# Patient Record
Sex: Female | Born: 1975 | Race: White | Hispanic: No | Marital: Married | Smoking: Current every day smoker
Health system: Southern US, Community
[De-identification: ages and names within clinical notes are randomized; demographics above are authoritative.]

## PROBLEM LIST (undated history)

## (undated) DIAGNOSIS — I10 Essential (primary) hypertension: Secondary | ICD-10-CM

## (undated) DIAGNOSIS — L700 Acne vulgaris: Secondary | ICD-10-CM

## (undated) DIAGNOSIS — F419 Anxiety disorder, unspecified: Secondary | ICD-10-CM

## (undated) DIAGNOSIS — E7800 Pure hypercholesterolemia, unspecified: Secondary | ICD-10-CM

## (undated) DIAGNOSIS — F32A Depression, unspecified: Secondary | ICD-10-CM

## (undated) DIAGNOSIS — F1010 Alcohol abuse, uncomplicated: Secondary | ICD-10-CM

## (undated) DIAGNOSIS — E119 Type 2 diabetes mellitus without complications: Secondary | ICD-10-CM

## (undated) DIAGNOSIS — F329 Major depressive disorder, single episode, unspecified: Secondary | ICD-10-CM

## (undated) HISTORY — DX: Anxiety disorder, unspecified: F41.9

## (undated) HISTORY — DX: Major depressive disorder, single episode, unspecified: F32.9

## (undated) HISTORY — DX: Depression, unspecified: F32.A

## (undated) HISTORY — DX: Acne vulgaris: L70.0

## (undated) HISTORY — DX: Type 2 diabetes mellitus without complications: E11.9

---

## 1997-08-02 ENCOUNTER — Other Ambulatory Visit: Admission: RE | Admit: 1997-08-02 | Discharge: 1997-08-02 | Payer: Self-pay | Admitting: Internal Medicine

## 1997-10-03 ENCOUNTER — Other Ambulatory Visit: Admission: RE | Admit: 1997-10-03 | Discharge: 1997-10-03 | Payer: Self-pay | Admitting: Obstetrics and Gynecology

## 1998-02-06 ENCOUNTER — Ambulatory Visit (HOSPITAL_COMMUNITY): Admission: RE | Admit: 1998-02-06 | Discharge: 1998-02-06 | Payer: Self-pay | Admitting: Obstetrics and Gynecology

## 1998-02-09 ENCOUNTER — Ambulatory Visit (HOSPITAL_COMMUNITY): Admission: RE | Admit: 1998-02-09 | Discharge: 1998-02-09 | Payer: Self-pay | Admitting: Obstetrics and Gynecology

## 1998-03-26 ENCOUNTER — Inpatient Hospital Stay (HOSPITAL_COMMUNITY): Admission: AD | Admit: 1998-03-26 | Discharge: 1998-03-26 | Payer: Self-pay | Admitting: Obstetrics and Gynecology

## 1998-03-28 ENCOUNTER — Inpatient Hospital Stay (HOSPITAL_COMMUNITY): Admission: AD | Admit: 1998-03-28 | Discharge: 1998-03-28 | Payer: Self-pay | Admitting: Obstetrics and Gynecology

## 1998-05-03 ENCOUNTER — Inpatient Hospital Stay (HOSPITAL_COMMUNITY): Admission: AD | Admit: 1998-05-03 | Discharge: 1998-05-06 | Payer: Self-pay | Admitting: Obstetrics and Gynecology

## 2012-05-05 ENCOUNTER — Encounter (HOSPITAL_COMMUNITY): Payer: Self-pay | Admitting: Emergency Medicine

## 2012-05-05 ENCOUNTER — Emergency Department (HOSPITAL_COMMUNITY)
Admission: EM | Admit: 2012-05-05 | Discharge: 2012-05-06 | Disposition: A | Discharge status: Other Acute Inpatient Hospital | Payer: Self-pay | Attending: Emergency Medicine | Admitting: Emergency Medicine

## 2012-05-05 DIAGNOSIS — I10 Essential (primary) hypertension: Secondary | ICD-10-CM | POA: Insufficient documentation

## 2012-05-05 DIAGNOSIS — F10939 Alcohol use, unspecified with withdrawal, unspecified: Secondary | ICD-10-CM | POA: Insufficient documentation

## 2012-05-05 DIAGNOSIS — F1010 Alcohol abuse, uncomplicated: Secondary | ICD-10-CM | POA: Insufficient documentation

## 2012-05-05 DIAGNOSIS — Z79899 Other long term (current) drug therapy: Secondary | ICD-10-CM | POA: Insufficient documentation

## 2012-05-05 DIAGNOSIS — F10239 Alcohol dependence with withdrawal, unspecified: Secondary | ICD-10-CM | POA: Insufficient documentation

## 2012-05-05 DIAGNOSIS — F17200 Nicotine dependence, unspecified, uncomplicated: Secondary | ICD-10-CM | POA: Insufficient documentation

## 2012-05-05 DIAGNOSIS — R000 Tachycardia, unspecified: Secondary | ICD-10-CM | POA: Insufficient documentation

## 2012-05-05 DIAGNOSIS — F1011 Alcohol abuse, in remission: Secondary | ICD-10-CM | POA: Insufficient documentation

## 2012-05-05 DIAGNOSIS — Z3202 Encounter for pregnancy test, result negative: Secondary | ICD-10-CM | POA: Insufficient documentation

## 2012-05-05 DIAGNOSIS — R110 Nausea: Secondary | ICD-10-CM | POA: Insufficient documentation

## 2012-05-05 DIAGNOSIS — R259 Unspecified abnormal involuntary movements: Secondary | ICD-10-CM | POA: Insufficient documentation

## 2012-05-05 HISTORY — DX: Essential (primary) hypertension: I10

## 2012-05-05 HISTORY — DX: Alcohol abuse, uncomplicated: F10.10

## 2012-05-05 LAB — COMPREHENSIVE METABOLIC PANEL
BUN: 9 mg/dL (ref 6–23)
CO2: 21 mEq/L (ref 19–32)
Calcium: 10 mg/dL (ref 8.4–10.5)
Creatinine, Ser: 0.67 mg/dL (ref 0.50–1.10)
GFR calc Af Amer: >90 mL/min (ref >90)
GFR calc non Af Amer: >90 mL/min (ref >90)
Glucose, Bld: 96 mg/dL (ref 70–99)
Total Bilirubin: 0.9 mg/dL (ref 0.3–1.2)

## 2012-05-05 LAB — CBC
HCT: 49.7 % — ABNORMAL HIGH (ref 36.0–46.0)
Hemoglobin: 18.4 g/dL — ABNORMAL HIGH (ref 12.0–15.0)
MCH: 35.4 pg — ABNORMAL HIGH (ref 26.0–34.0)
MCV: 95.6 fL (ref 78.0–100.0)
RBC: 5.2 MIL/uL — ABNORMAL HIGH (ref 3.87–5.11)

## 2012-05-05 LAB — POCT PREGNANCY, URINE: Preg Test, Ur: NEGATIVE

## 2012-05-05 LAB — RAPID URINE DRUG SCREEN, HOSP PERFORMED: Opiates: NOT DETECTED

## 2012-05-05 LAB — SALICYLATE LEVEL: Salicylate Lvl: <2 mg/dL — ABNORMAL LOW (ref 2.8–20.0)

## 2012-05-05 MED ORDER — LORAZEPAM 1 MG PO TABS
2.0000 mg | ORAL_TABLET | Freq: Once | ORAL | Status: AC
  Administered 2012-05-05: 2 mg via ORAL
  Filled 2012-05-05: qty 4

## 2012-05-05 MED ORDER — LORAZEPAM 1 MG PO TABS: 0.0000 mg | ORAL_TABLET | Freq: Two times a day (BID) | ORAL | Status: DC

## 2012-05-05 MED ORDER — FOLIC ACID 1 MG PO TABS
1.0000 mg | ORAL_TABLET | Freq: Every day | ORAL | Status: DC
Start: 2012-05-05 — End: 2012-05-06
  Administered 2012-05-05 – 2012-05-06 (×2): 1 mg via ORAL
  Filled 2012-05-05 (×2): qty 1

## 2012-05-05 MED ORDER — ONDANSETRON HCL 8 MG PO TABS
4.0000 mg | ORAL_TABLET | Freq: Three times a day (TID) | ORAL | Status: DC | PRN
  Administered 2012-05-05: 4 mg via ORAL
  Filled 2012-05-05: qty 1

## 2012-05-05 MED ORDER — LORAZEPAM 1 MG PO TABS
0.0000 mg | ORAL_TABLET | Freq: Four times a day (QID) | ORAL | Status: DC
  Administered 2012-05-05: 1 mg via ORAL
  Filled 2012-05-05: qty 2
  Filled 2012-05-05: qty 1

## 2012-05-05 MED ORDER — THIAMINE HCL 100 MG/ML IJ SOLN: 100.0000 mg | Freq: Every day | INTRAMUSCULAR | Status: DC

## 2012-05-05 MED ORDER — LORAZEPAM 1 MG PO TABS: 1.0000 mg | ORAL_TABLET | Freq: Four times a day (QID) | ORAL | Status: DC | PRN

## 2012-05-05 MED ORDER — ADULT MULTIVITAMIN W/MINERALS CH
1.0000 | ORAL_TABLET | Freq: Every day | ORAL | Status: DC
  Administered 2012-05-05 – 2012-05-06 (×2): 1 via ORAL
  Filled 2012-05-05 (×2): qty 1

## 2012-05-05 MED ORDER — NICOTINE 21 MG/24HR TD PT24: 21.0000 mg | MEDICATED_PATCH | Freq: Every day | TRANSDERMAL | Status: DC

## 2012-05-05 MED ORDER — LORAZEPAM 2 MG/ML IJ SOLN: 1.0000 mg | Freq: Four times a day (QID) | INTRAMUSCULAR | Status: DC | PRN

## 2012-05-05 MED ORDER — VITAMIN B-1 100 MG PO TABS
100.0000 mg | ORAL_TABLET | Freq: Every day | ORAL | Status: DC
  Administered 2012-05-05 – 2012-05-06 (×2): 100 mg via ORAL
  Filled 2012-05-05 (×2): qty 1

## 2012-05-05 NOTE — ED Notes (Signed)
Pt in hallway using the phone.

## 2012-05-05 NOTE — ED Notes (Signed)
PATIENT ORIENTED TO THE ROOM. QUESTIONS ANSWERED. PT MADE COMFORTABLE. SAFE ENVIRONMENT PROVIDED FOR PT TO BEGIN DETOX.

## 2012-05-05 NOTE — ED Notes (Signed)
Pt here requesting detox from ETOH; pt sts drinks approx fifth a day; pt sts last drink last night; pt denies SI/HI and is tearful

## 2012-05-05 NOTE — ED Notes (Signed)
PATIENT STATES SHE BECAME A DAILY DRINKER OF LIQUOR A FEW YEARS AGO AFTER STARTING ON PROZAC. SHE HAS SINCE STOPPED THE PROZAC BUT HAS NOT BEEN SUCCESSFUL AT STOPPING ALCOHOL. STATES SHE TRIED TO STOP DRINKING OVER EASTER AND LASTED 2 DAYS. STATES THE SHAKING AND VOMITING WERE OUT OF CONTROL. STATES SHE DRANK A WHOLE FIFTH OF LIQUOR YESTERDAY. SHE IS UNSURE OF WHAT TIME LAST NIGHT SHE TOOK HER LAST DRINK. STATES SHE HAS NOT HAD ANYTHING TO DRINK TODAY. PT VISIBLY SHAKEY.

## 2012-05-05 NOTE — ED Provider Notes (Signed)
History     CSN: [id number]  Arrival date & time 05/05/12  1127   First MD Initiated Contact with Patient 05/05/12 1406      Chief Complaint  Patient presents with  . Medical Clearance  . Alcohol Problem    (Consider location/radiation/quality/duration/timing/severity/associated sxs/prior treatment) HPI Comments: Patient presents to ED requesting ETOH detox -- states she drinks one fifth of liquor a day for months (whiskey/vodka). No h/o seizure, DT withdrawing in the past. Patient denies other medical complaints. No SI/HI. Onset of symptoms insidious. Course is constant. Nothing makes symptoms worse.  Patient is a 37 y.o. female presenting with alcohol problem. The history is provided by the patient.  Alcohol Problem Associated symptoms include nausea. Pertinent negatives include no abdominal pain, chest pain, coughing, fever, headaches, myalgias, rash, sore throat or vomiting.    Past Medical History  Diagnosis Date  . Hypertension   . Alcohol abuse     History reviewed. No pertinent past surgical history.  History reviewed. No pertinent family history.  History  Substance Use Topics  . Smoking status: Current Every Day Smoker  . Smokeless tobacco: Not on file  . Alcohol Use: Yes    OB History   Grav Para Term Preterm Abortions TAB SAB Ect Mult Living                  Review of Systems  Constitutional: Negative for fever.  HENT: Negative for sore throat and rhinorrhea.   Eyes: Negative for redness.  Respiratory: Negative for cough.   Cardiovascular: Negative for chest pain.  Gastrointestinal: Positive for nausea. Negative for vomiting, abdominal pain and diarrhea.  Genitourinary: Negative for dysuria.  Musculoskeletal: Negative for myalgias.  Skin: Negative for rash.  Neurological: Positive for tremors. Negative for headaches.    Allergies  Aleve; Codeine; Penicillins; and Vicodin  Home Medications   Current Outpatient Rx  Name  Route  Sig  Dispense   Refill  . famotidine (PEPCID) 20 MG tablet   Oral   Take 20 mg by mouth 2 (two) times daily.         . metoprolol (LOPRESSOR) 50 MG tablet   Oral   Take 50 mg by mouth 2 (two) times daily.         Marland Kitchen omeprazole (PRILOSEC) 10 MG capsule   Oral   Take 20 mg by mouth 2 (two) times daily.           BP 164/104  Pulse 120  Temp(Src) 98.3 F (36.8 C) (Oral)  Resp 18  SpO2 97%  Physical Exam  Nursing note and vitals reviewed. Constitutional: She appears well-developed and well-nourished.  HENT:  Head: Normocephalic and atraumatic.  Eyes: Conjunctivae are normal. Right eye exhibits no discharge. Left eye exhibits no discharge.  Neck: Normal range of motion. Neck supple.  Cardiovascular: Regular rhythm and normal heart sounds.  Tachycardia present.   Pulses:      Radial pulses are 2+ on the right side, and 2+ on the left side.  Pulmonary/Chest: Effort normal and breath sounds normal.  Abdominal: Soft. There is no tenderness.  Neurological: She is alert.  Tremulous.  Skin: Skin is warm and dry.  Psychiatric: She exhibits a depressed mood. She expresses no homicidal and no suicidal ideation.  Tearful    ED Course  Procedures (including critical care time)  Labs Reviewed  CBC - Abnormal; Notable for the following:    RBC 5.20 (*)    Hemoglobin 18.4 (*)  HCT 49.7 (*)    MCH 35.4 (*)    MCHC 37.0 (*)    All other components within normal limits  COMPREHENSIVE METABOLIC PANEL - Abnormal; Notable for the following:    Potassium 3.3 (*)    AST 77 (*)    ALT 67 (*)    Alkaline Phosphatase 155 (*)    All other components within normal limits  ETHANOL - Abnormal; Notable for the following:    Alcohol, Ethyl (B) 28 (*)    All other components within normal limits  SALICYLATE LEVEL - Abnormal; Notable for the following:    Salicylate Lvl <2.0 (*)    All other components within normal limits  ACETAMINOPHEN LEVEL  URINE RAPID DRUG SCREEN (HOSP PERFORMED)   No results  found.   1. Alcohol withdrawal   2. Alcohol abuse     2:31 PM Patient seen and examined. Holding orders completed. CIWA initiated.   Vital signs reviewed and are as follows: Filed Vitals:   05/05/12 1426  BP: 164/104  Pulse: 120  Temp:   Resp:    3:42 PM ACT Belenda Cruise) has seen. Awaiting placement. Pt is medically cleared.    MDM  ETOH detox.         Renne Crigler, PA-C 05/05/12 [phone]
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
## 2012-05-05 NOTE — ED Notes (Signed)
PATIENT MAY COME TO POD C 24 WHEN MEDICALLY CLEAR.

## 2012-05-06 ENCOUNTER — Inpatient Hospital Stay (HOSPITAL_COMMUNITY)
Admission: AD | Admit: 2012-05-06 | Discharge: 2012-05-12 | DRG: 750 | Disposition: A | Discharge status: Home / Self Care | Payer: BC Managed Care – PPO | Source: Intra-hospital | Attending: Psychiatry | Admitting: Psychiatry

## 2012-05-06 ENCOUNTER — Encounter (HOSPITAL_COMMUNITY): Payer: Self-pay | Admitting: *Deleted

## 2012-05-06 DIAGNOSIS — F332 Major depressive disorder, recurrent severe without psychotic features: Secondary | ICD-10-CM | POA: Diagnosis present

## 2012-05-06 DIAGNOSIS — F1011 Alcohol abuse, in remission: Secondary | ICD-10-CM | POA: Diagnosis present

## 2012-05-06 DIAGNOSIS — Z79899 Other long term (current) drug therapy: Secondary | ICD-10-CM

## 2012-05-06 DIAGNOSIS — I10 Essential (primary) hypertension: Secondary | ICD-10-CM | POA: Diagnosis present

## 2012-05-06 DIAGNOSIS — L0291 Cutaneous abscess, unspecified: Secondary | ICD-10-CM

## 2012-05-06 DIAGNOSIS — F411 Generalized anxiety disorder: Secondary | ICD-10-CM | POA: Diagnosis present

## 2012-05-06 DIAGNOSIS — F1010 Alcohol abuse, uncomplicated: Principal | ICD-10-CM | POA: Diagnosis present

## 2012-05-06 MED ORDER — ONDANSETRON 4 MG PO TBDP: 4.0000 mg | ORAL_TABLET | Freq: Four times a day (QID) | ORAL | Status: AC | PRN

## 2012-05-06 MED ORDER — CHLORDIAZEPOXIDE HCL 25 MG PO CAPS
25.0000 mg | ORAL_CAPSULE | Freq: Four times a day (QID) | ORAL | Status: AC
  Administered 2012-05-06 (×2): 25 mg via ORAL
  Filled 2012-05-06 (×2): qty 1

## 2012-05-06 MED ORDER — NICOTINE 21 MG/24HR TD PT24
21.0000 mg | MEDICATED_PATCH | Freq: Every day | TRANSDERMAL | Status: DC
  Filled 2012-05-06 (×4): qty 1

## 2012-05-06 MED ORDER — PANTOPRAZOLE SODIUM 40 MG PO TBEC
40.0000 mg | DELAYED_RELEASE_TABLET | Freq: Every day | ORAL | Status: DC
  Administered 2012-05-06 – 2012-05-12 (×7): 40 mg via ORAL
  Filled 2012-05-06 (×9): qty 1

## 2012-05-06 MED ORDER — VITAMIN B-1 100 MG PO TABS
100.0000 mg | ORAL_TABLET | Freq: Every day | ORAL | Status: DC
  Administered 2012-05-07 – 2012-05-12 (×6): 100 mg via ORAL
  Filled 2012-05-06 (×7): qty 1

## 2012-05-06 MED ORDER — CHLORDIAZEPOXIDE HCL 25 MG PO CAPS
25.0000 mg | ORAL_CAPSULE | Freq: Four times a day (QID) | ORAL | Status: AC | PRN
  Administered 2012-05-07: 25 mg via ORAL

## 2012-05-06 MED ORDER — FAMOTIDINE 20 MG PO TABS
20.0000 mg | ORAL_TABLET | Freq: Two times a day (BID) | ORAL | Status: DC
  Filled 2012-05-06 (×4): qty 1

## 2012-05-06 MED ORDER — THIAMINE HCL 100 MG/ML IJ SOLN: 100.0000 mg | Freq: Once | INTRAMUSCULAR | Status: DC

## 2012-05-06 MED ORDER — CHLORDIAZEPOXIDE HCL 25 MG PO CAPS
50.0000 mg | ORAL_CAPSULE | Freq: Once | ORAL | Status: AC
  Administered 2012-05-06: 50 mg via ORAL
  Filled 2012-05-06: qty 2

## 2012-05-06 MED ORDER — MAGNESIUM HYDROXIDE 400 MG/5ML PO SUSP: 30.0000 mL | Freq: Every day | ORAL | Status: DC | PRN

## 2012-05-06 MED ORDER — TRAZODONE HCL 50 MG PO TABS
50.0000 mg | ORAL_TABLET | Freq: Every evening | ORAL | Status: DC | PRN
  Administered 2012-05-06 – 2012-05-07 (×2): 50 mg via ORAL
  Filled 2012-05-06: qty 3
  Filled 2012-05-06 (×2): qty 1

## 2012-05-06 MED ORDER — METOPROLOL TARTRATE 25 MG PO TABS
50.0000 mg | ORAL_TABLET | Freq: Once | ORAL | Status: AC
  Administered 2012-05-06: 50 mg via ORAL
  Filled 2012-05-06: qty 2

## 2012-05-06 MED ORDER — ALUM & MAG HYDROXIDE-SIMETH 200-200-20 MG/5ML PO SUSP
30.0000 mL | ORAL | Status: DC | PRN
  Administered 2012-05-11: 30 mL via ORAL

## 2012-05-06 MED ORDER — LOPERAMIDE HCL 2 MG PO CAPS: 2.0000 mg | ORAL_CAPSULE | ORAL | Status: AC | PRN

## 2012-05-06 MED ORDER — CHLORDIAZEPOXIDE HCL 25 MG PO CAPS
25.0000 mg | ORAL_CAPSULE | Freq: Every day | ORAL | Status: AC
  Administered 2012-05-09: 25 mg via ORAL
  Filled 2012-05-06: qty 1

## 2012-05-06 MED ORDER — ACETAMINOPHEN 325 MG PO TABS: 650.0000 mg | ORAL_TABLET | Freq: Four times a day (QID) | ORAL | Status: DC | PRN

## 2012-05-06 MED ORDER — CHLORDIAZEPOXIDE HCL 25 MG PO CAPS
25.0000 mg | ORAL_CAPSULE | ORAL | Status: AC
  Administered 2012-05-08 (×2): 25 mg via ORAL
  Filled 2012-05-06 (×3): qty 1

## 2012-05-06 MED ORDER — CHLORDIAZEPOXIDE HCL 25 MG PO CAPS
25.0000 mg | ORAL_CAPSULE | Freq: Three times a day (TID) | ORAL | Status: AC
  Administered 2012-05-07 (×3): 25 mg via ORAL
  Filled 2012-05-06 (×3): qty 1

## 2012-05-06 MED ORDER — METOPROLOL TARTRATE 50 MG PO TABS
50.0000 mg | ORAL_TABLET | Freq: Two times a day (BID) | ORAL | Status: DC
  Administered 2012-05-06 – 2012-05-12 (×12): 50 mg via ORAL
  Filled 2012-05-06 (×15): qty 1

## 2012-05-06 MED ORDER — HYDROXYZINE HCL 25 MG PO TABS
25.0000 mg | ORAL_TABLET | Freq: Four times a day (QID) | ORAL | Status: AC | PRN
  Administered 2012-05-08 (×3): 25 mg via ORAL

## 2012-05-06 MED ORDER — METOPROLOL TARTRATE 50 MG PO TABS: 50.0000 mg | ORAL_TABLET | Freq: Two times a day (BID) | ORAL | Status: DC

## 2012-05-06 MED ORDER — ADULT MULTIVITAMIN W/MINERALS CH
1.0000 | ORAL_TABLET | Freq: Every day | ORAL | Status: DC
  Administered 2012-05-06 – 2012-05-12 (×7): 1 via ORAL
  Filled 2012-05-06 (×9): qty 1

## 2012-05-06 NOTE — ED Notes (Signed)
Pt. oob to the bathroom, gait steady. Pt. Getting a shower
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
## 2012-05-06 NOTE — ED Notes (Signed)
Breakfast tray delivered to patient room.

## 2012-05-06 NOTE — Tx Team (Signed)
Initial Interdisciplinary Treatment Plan  PATIENT STRENGTHS: (choose at least two) Ability for insight Average or above average intelligence Capable of independent living General fund of knowledge Motivation for treatment/growth Supportive family/friends  PATIENT STRESSORS: Marital or family conflict Medication change or noncompliance Substance abuse   PROBLEM LIST: Problem List/Patient Goals Date to be addressed Date deferred Reason deferred Estimated date of resolution  "whats causing me to drink/possible triggers"            "i need to learn some coping skills"                                           DISCHARGE CRITERIA:  Ability to meet basic life and health needs Improved stabilization in mood, thinking, and/or behavior Motivation to continue treatment in a less acute level of care Need for constant or close observation no longer present Reduction of life-threatening or endangering symptoms to within safe limits Verbal commitment to aftercare and medication compliance Withdrawal symptoms are absent or subacute and managed without 24-hour nursing intervention  PRELIMINARY DISCHARGE PLAN: Attend 12-step recovery group Return to previous living arrangement  PATIENT/FAMIILY INVOLVEMENT: This treatment plan has been presented to and reviewed with the patient, Rhonda Castro.  The patient has been given the opportunity to ask questions and make suggestions.  Jule Ser 05/06/2012, 4:23 PM

## 2012-05-06 NOTE — Progress Notes (Signed)
Patient ID: Rhonda Castro, female   DOB: 03/19/1975, 37 y.o.   MRN: [id number] Pt admitted today for detox from ETOH.  She went to [facility] and requested detox from alcohol.  She reports drinking at least a 1/5 of vodka everyday and this is her first time to get help. Her BAL was 28 on 4/29.  She has some rare disorder that causes boil-like places all over her body. 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  She denies any S/H ideation or A/V hallucinations.  She reported that she was sober for about 2 months (2 years ago) but has continued to drink since then.  She is not sure what she plans to do at discharge rather she will go in-patient or out patient.

## 2012-05-06 NOTE — ED Provider Notes (Signed)
Medical screening examination/treatment/procedure(s) were performed by non-physician practitioner and as supervising physician I was immediately available for consultation/collaboration.   Suzi Roots, MD 05/06/12 [id number]

## 2012-05-06 NOTE — ED Provider Notes (Signed)
Patient is eating breakfast and in no acute distress. She does not have tremors currently a.m. blood pressure has now improved to 140/98. Her heart rate is now normalized at less than 100. Feel that she is stable to go to behavioral health for alcohol detox.  Gwyneth Sprout, MD 05/06/12 [phone]

## 2012-05-06 NOTE — ED Notes (Signed)
Report given To Kendell Bane at [facility].  Reported to Dr. Anitra Lauth that Potassium 3.3, orders received to give orange juice and a banana

## 2012-05-07 DIAGNOSIS — F332 Major depressive disorder, recurrent severe without psychotic features: Secondary | ICD-10-CM

## 2012-05-07 DIAGNOSIS — F1010 Alcohol abuse, uncomplicated: Principal | ICD-10-CM

## 2012-05-07 MED ORDER — SERTRALINE HCL 25 MG PO TABS
25.0000 mg | ORAL_TABLET | Freq: Every day | ORAL | Status: DC
  Administered 2012-05-07 – 2012-05-12 (×6): 25 mg via ORAL
  Filled 2012-05-07 (×8): qty 1
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
## 2012-05-07 NOTE — Progress Notes (Signed)
Patient ID: Rhonda Castro, female   DOB: December 12, 1975, 37 y.o.   MRN: [id number] D: "I'm doing better."  A: Pt.denies lethality and A/V/H's, but admits to feeling shaky and having withdrawals. Pt. Took her ordered meds and has responded well today. Pt. programming on 300 hall.  Pt. States she does miss her children and agreed that she needs to stop drinking, but "if I have a sore stomach, I think if I just have one drink, it'll be better, then I drink another one."  Pt. Is appropriate with staff and peers and seems to be vested in her recovery from ETOH.  R: Will continue to monitor for changes.

## 2012-05-07 NOTE — Progress Notes (Signed)
Patient ID: Rhonda Castro, female   DOB: 1975/06/24, 37 y.o.   MRN: [id number] D: Patient presented with depressed mood and flat affect. Pt crying in her room stating she missing her family. Pt denies any s/s of withdrawals. Pt denies SI/HI/AVH and pain. Pt attended evening wrap up group on the 300 hall before moving to the 500 hall. Pt denies any needs or concerns.  Cooperative with assessment. No acute distressed noted at this time.   A: Met with pt 1:1. Medications administered as prescribed. Writer encouraged pt to discuss feelings. Pt encouraged to come to staff with any question or concerns. 15 minutes checks for safety.  R: Patient remains safe. She is complaint with medications and denies any adverse reaction. Continue current POC.

## 2012-05-07 NOTE — [facility] Suicide Risk Assessment (Signed)
Suicide Risk Assessment  Admission Assessment     Nursing information obtained from:    Demographic factors:   Female, caucasian Current Mental Status:   Alert and oriented to 4. Crying, depressed, shaky from withdrawal symptoms. Denies SI/HI/VH/AH. Loss Factors:    Historical Factors:   history of alcohol abuse. Risk Reduction Factors:   desire for improvement.  CLINICAL FACTORS:   Alcohol/Substance Abuse/Dependencies  COGNITIVE FEATURES THAT CONTRIBUTE TO RISK:  Cognitively intact  SUICIDE RISK:   Mild:  Suicidal ideation of limited frequency, intensity, duration, and specificity.  There are no identifiable plans, no associated intent, mild dysphoria and related symptoms, good self-control (both objective and subjective assessment), few other risk factors, and identifiable protective factors, including available and accessible social support.  PLAN OF CARE: Start alcohol detox protocol. Start Zoloft at 25mg  po qd. Provide supportive counselling and education  I certify that inpatient services furnished can reasonably be expected to improve the patient's condition.  Delanna Blacketer 05/07/2012, 1:44 PM

## 2012-05-07 NOTE — Progress Notes (Signed)
Pt actively participated in [place] group by performing a song with peers.

## 2012-05-07 NOTE — [facility] Group Notes (Signed)
[facility] LCSW Aftercare Discharge Planning Group Note   05/07/2012 9:21 AM  Participation Quality:  Did not attend group.    Rhonda Castro, Rhonda Castro

## 2012-05-07 NOTE — Progress Notes (Signed)
Pt is very talkatvie and appears vested in her treatment. She stated she has been drinking since the age of 71 but really went on a binge about 8 months ago due to conflicts which she did not wish to go into. Pt stated she has three children ages 94, 39 and 31 and that her mother in law is caring for them while she is an inpatient. Pt has been attending groups and programs on the 300 hall. She does appear to get along with the other pts. On the hall and did go outside for recreation therapy. Denies SI ro HI and does contract for safety. Remains on Q 15 minute checks.
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
## 2012-05-07 NOTE — B[facility] Group Notes (Signed)
[facility] LCSW Aftercare Discharge Planning Group Note   05/07/2012  8:45 AM  Participation Quality:  Appropriate  Mood/Affect:  Anxious, Depressed and Tearful.  Patient remarked "I don't understand how ya'll (other members of group) can be so flip with this. 'I am good to go' just doesn't make sense to me. It is hard to stay sober!"  Depression Rating:  4  Anxiety Rating:  5  Thoughts of Suicide:  No Will you contract for safety?   NA  Current AVH:  No  Plan for Discharge/Comments:  This is patient's first time in detox although alcohol has been an issue for an extended period of time; reports desire to go to inpatient treatment treatment. CSW to explore options with patient in one to one conversation.   Transportation Means:  family  Supports: Husband, children and extended family   Dyane Dustman, Julious Payer

## 2012-05-07 NOTE — BHH Group Notes (Signed)
[facility] LCSW Group Therapy  05/07/2012 3:37 PM  Type of Therapy:  Group Therapy  Participation Level:  Did Not Attend    Rhonda Castro, Rhonda Castro 05/07/2012, 3:37 PM

## 2012-05-08 DIAGNOSIS — F1994 Other psychoactive substance use, unspecified with psychoactive substance-induced mood disorder: Secondary | ICD-10-CM

## 2012-05-08 MED ORDER — ENSURE COMPLETE PO LIQD
237.0000 mL | Freq: Two times a day (BID) | ORAL | Status: DC
  Administered 2012-05-10 – 2012-05-12 (×2): 237 mL via ORAL

## 2012-05-08 NOTE — BHH Group Notes (Signed)
[facility] LCSW Group Therapy  05/08/2012 1:15 PM  Type of Therapy:  Group Therapy 1:15 to 2:30 PM  Participation Level:  Active  Participation Quality:  Appropriate  Affect:  Anxious, Appropriate and Tearful  Cognitive:  Alert and Oriented  Insight:  Improving  Engagement in Therapy:  Engaged  Modes of Intervention:  Discussion, Exploration, Socialization and Support  Summary of Progress/Problems: Focus of group processing discussion was on balance in life; the components in life which have a negative influence on balance and the components which make for a more balanced life.  Patient shared   Rhonda Castro 05/08/2012, 4:41 PM

## 2012-05-08 NOTE — Progress Notes (Signed)
  D) Patient pleasant and cooperative upon my assessment. Patient completed Patient Self Inventory, reports slept "fair," and  appetite is "good." Patient rates depression as   4/10, patient rates hopeless feelings as  1/10. Patient denies SI/HI, denies A/V hallucinations.   A) Patient offered support and encouragement, patient encouraged to discuss feelings/concerns with staff. Patient verbalized understanding. Patient monitored Q15 minutes for safety. Patient met with MD  to discuss today's goals and plan of care.  R) Patient visible in milieu, attending groups in day room and meals in dining room. Patient appropriate with staff and peers.   Patient taking medications as ordered. Patient has a plan to start "staying active" moving forward. Will continue to monitor.

## 2012-05-08 NOTE — Progress Notes (Signed)
Patient ID: Rhonda Castro, female   DOB: 27-Apr-1975, 36 y.o.   MRN: [id number] CSW contacted husband Delorse Shane at [phone]; was only able to leave message at 11:00 AM. Also contacted admissions at [facility] and faxed requested information to them.  Expect to hear back from them today re insurance, they do have an open treatment bed for Monday May 5th which they are holding at this time. Carney Bern, LCSWA

## 2012-05-08 NOTE — Progress Notes (Signed)
D: Writer went to patient's room this morning to assess her CI WA score and check if she needs her Nicotine patch. Patient was neither in her room nor in the dayroom.  A:Writer  asked the Tech if she knew where Pt was; Tech said she saw her few minutes ago. Tech checked other patient's room and found patient in another patient room; this other patient was agitated, loud and angry earlier. Writer asked patient if she was aware that she is not to go into another patient's room and told patient that it's in her treatment agreement not to go into other patient's room. R: Patient responded that she wasn't aware, she walked to the medication window and started complaining that staff not doing there jobs, she was tearful and irritable. Writer told patient to focus on herself and on her purpose of being here. Also that it is alright  to sympathize with other patients but she should try not to add issues in other patent's life to her own problem. Patient wiped off her face and stated; "Iunderstand my mother used to tell me the same thing".  Writer gave patient Vistaril 25 mg for anxiety. She apologized and went for breakfast after that. Q 15 minute check continues as ordered to maintain safety.

## 2012-05-08 NOTE — Progress Notes (Signed)
Adult Psychoeducational Group Note  Date:  05/08/2012 Time:  11:12 AM  Group Topic/Focus:  Wellness Toolbox:   The focus of this group is to discuss various aspects of wellness, balancing those aspects and exploring ways to increase the ability to experience wellness.  Patients will create a wellness toolbox for use upon discharge.  Participation Level:  Active  Participation Quality:  Attentive  Affect:  Appropriate  Cognitive:  Oriented  Insight: Good  Engagement in Group:  Engaged  Modes of Intervention:  Activity  Additional Comments:  Pt stated her goal for the day is to stay emotionally stable  Kyel Purk T 05/08/2012, 11:12 AM

## 2012-05-08 NOTE — B[facility] Group Notes (Signed)
[facility] LCSW Aftercare Discharge Planning Group Note   05/08/2012 8:45 AM  Participation Quality:  Appropriate  Mood/Affect:  Anxious, Depressed and Tearful  Depression Rating:  6  Anxiety Rating:  5  Thoughts of Suicide:  No Will you contract for safety?   NA  Current AVH:  No  Plan for Discharge/Comments:  Explore SA treatment opportunities; CSW provided information on [facility] as patient is interested in staying in [place] area close to mother and sister  Transportation Means: Family  Supports: Family, Mother, Sister, Husband  Dyane Dustman, Julious Payer

## 2012-05-08 NOTE — Progress Notes (Signed)
D.  Pt pleasant on approach, denies complaints at this time.  Positive for evening group on 300 hall.  Interacting appropriately within the milieu.  Denies SI/HI/hallucinatons at this time.  A.  Support and encouragement offered  R.  Pt remains safe on unit, will continue to monitor.

## 2012-05-08 NOTE — Progress Notes (Signed)
D:PAtient appeared anxious at the beginning of this shift. She seemed irritable and angry, denied SI/Hi and denied Hallucnations. A: Writer encouraged patient to attend Karaoke.  R: She attended Rhonda Castro and reported that she enjoyed it. Her mood and affect bright and calm after the Karaoke. She received her HS medications without difficulty and Q 15 minute check continues as ordered to maintain safety.

## 2012-05-08 NOTE — Tx Team (Signed)
Interdisciplinary Treatment Plan Update (Adult)  Date: 05/08/2012  Time Reviewed: 2:49 PM   Progress in Treatment: Attending groups: Yes Participating in groups: Yes Taking medication as prescribed:  Yes Tolerating medication:  Yes Family/Significant othe contact made: Yes Rhonda Castro understands diagnosis: Yes Discussing Rhonda Castro identified problems/goals with staff: Yes Medical problems stabilized or resolved:  Yes Denies suicidal/homicidal ideation: Yes Rhonda Castro has not harmed self or Others: Yes  New problem(s) identified: None Identified  Discharge Plan or Barriers:  CSW has referred Rhonda Castro to [facility], they will be in contact with Rhonda Castro's husband re the financial piece.   Additional comments: N/A  Reason for Continuation of Hospitalization: Anxiety Depression Medication stabilization Withdrawal symptoms   Estimated length of stay: 3-4 days  For review of initial/current Rhonda Castro goals, please see plan of care.  Attendees: Rhonda Castro:     Family:     Physician:  Patrick North, MD 05/08/2012 2:49 PM   Nursing:    05/08/2012 2:49 PM   Clinical Social Worker Ronda Fairly 05/08/2012 2:49 PM   Other:   05/08/2012 2:49 PM   Other:   05/08/2012 2:49 PM   Other:   05/08/2012 2:49 PM   Other:   05/08/2012 2:49 PM    Scribe for Treatment Team:   Carney Bern, LCSWA  05/08/2012 2:49 PM

## 2012-05-08 NOTE — Progress Notes (Signed)
[facility] MD Progress Note  05/08/2012 11:35 AM Rhonda Castro  MRN:  [id number] Subjective:  Patient reports that is upset about not getting her dentures on time, feels she was ignored by staff. Mood slightly better today, not crying as much. Sleeping okay, tolerating zoloft okay. Denies any side effects. Not complaining of withdrawal symptoms.  Diagnosis:   Axis I: Alcohol Abuse and Substance Induced Mood Disorder Axis II: Deferred Axis III:  Past Medical History  Diagnosis Date  . Hypertension   . Alcohol abuse    Axis IV: other psychosocial or environmental problems Axis V: 41-50 serious symptoms  ADL's:  Intact  Sleep: Fair  Appetite:  Fair   Psychiatric Specialty Exam: Review of Systems  Constitutional: Negative.   HENT: Negative.   Eyes: Negative.   Respiratory: Negative.   Cardiovascular: Negative.   Gastrointestinal: Negative.   Genitourinary: Negative.   Musculoskeletal: Negative.   Skin: Negative.   Neurological: Negative.   Endo/Heme/Allergies: Negative.   Psychiatric/Behavioral: Positive for depression and substance abuse. The patient is nervous/anxious.     Blood pressure 138/100, pulse 99, temperature 96.9 F (36.1 C), temperature source Oral, resp. rate 16, height 5\' 5"  (1.651 m), weight 71.215 kg (157 lb), SpO2 98.00%.Body mass index is 26.13 kg/(m^2).  General Appearance: Casual  Eye Contact::  Fair  Speech:  Normal Rate  Volume:  Decreased  Mood:  Anxious, Depressed and Dysphoric  Affect:  Constricted and Depressed  Thought Process:  Circumstantial  Orientation:  Full (Time, Place, and Person)  Thought Content:  Rumination  Suicidal Thoughts:  No  Homicidal Thoughts:  No  Memory:  Immediate;   Fair Recent;   Fair Remote;   Fair  Judgement:  Fair  Insight:  Fair  Psychomotor Activity:  Normal  Concentration:  Fair  Recall:  Fair  Akathisia:  No  Handed:  Right  AIMS (if indicated):     Assets:  Communication Skills Desire for  Improvement Financial Resources/Insurance Housing Social Support  Sleep:  Number of Hours: 6   Current Medications: Current Facility-Administered Medications  Medication Dose Route Frequency Provider Last Rate Last Dose  . acetaminophen (TYLENOL) tablet 650 mg  650 mg Oral Q6H PRN Sanjuana Kava, NP      . alum & mag hydroxide-simeth (MAALOX/MYLANTA) 200-200-20 MG/5ML suspension 30 mL  30 mL Oral Q4H PRN Sanjuana Kava, NP      . chlordiazePOXIDE (LIBRIUM) capsule 25 mg  25 mg Oral Q6H PRN Sanjuana Kava, NP   25 mg at 05/07/12 2152  . chlordiazePOXIDE (LIBRIUM) capsule 25 mg  25 mg Oral BH-qamhs Sanjuana Kava, NP   25 mg at 05/08/12 0755   Followed by  . [START ON 05/09/2012] chlordiazePOXIDE (LIBRIUM) capsule 25 mg  25 mg Oral Daily Sanjuana Kava, NP      . hydrOXYzine (ATARAX/VISTARIL) tablet 25 mg  25 mg Oral Q6H PRN Sanjuana Kava, NP   25 mg at 05/08/12 0654  . loperamide (IMODIUM) capsule 2-4 mg  2-4 mg Oral PRN Sanjuana Kava, NP      . magnesium hydroxide (MILK OF MAGNESIA) suspension 30 mL  30 mL Oral Daily PRN Sanjuana Kava, NP      . metoprolol (LOPRESSOR) tablet 50 mg  50 mg Oral BID Sanjuana Kava, NP   50 mg at 05/08/12 0756  . multivitamin with minerals tablet 1 tablet  1 tablet Oral Daily Sanjuana Kava, NP   1 tablet at 05/08/12 0755  .  nicotine (NICODERM CQ - dosed in mg/24 hours) patch 21 mg  21 mg Transdermal Q0600 Sanjuana Kava, NP      . ondansetron (ZOFRAN-ODT) disintegrating tablet 4 mg  4 mg Oral Q6H PRN Sanjuana Kava, NP      . pantoprazole (PROTONIX) EC tablet 40 mg  40 mg Oral Daily Sanjuana Kava, NP   40 mg at 05/08/12 0755  . sertraline (ZOLOFT) tablet 25 mg  25 mg Oral Daily Damieon Armendariz, MD   25 mg at 05/08/12 0755  . thiamine (B-1) injection 100 mg  100 mg Intramuscular Once Sanjuana Kava, NP      . thiamine (VITAMIN B-1) tablet 100 mg  100 mg Oral Daily Sanjuana Kava, NP   100 mg at 05/08/12 0755  . traZODone (DESYREL) tablet 50 mg  50 mg Oral QHS PRN Sanjuana Kava, NP   50 mg at 05/07/12 2151    Lab Results: No results found for this or any previous visit (from the past 48 hour(s)).  Physical Findings: AIMS: Facial and Oral Movements Muscles of Facial Expression: None, normal Lips and Perioral Area: None, normal Jaw: None, normal Tongue: None, normal,Extremity Movements Upper (arms, wrists, hands, fingers): None, normal Lower (legs, knees, ankles, toes): None, normal, Trunk Movements Neck, shoulders, hips: None, normal, Overall Severity Severity of abnormal movements (highest score from questions above): None, normal Incapacitation due to abnormal movements: None, normal Patient's awareness of abnormal movements (rate only patient's report): No Awareness, Dental Status Current problems with teeth and/or dentures?: Yes Does patient usually wear dentures?: Yes (pt did not bring in)  CIWA:  CIWA-Ar Total: 0 COWS:     Treatment Plan Summary: Daily contact with patient to assess and evaluate symptoms and progress in treatment Medication management  Plan: Continue current plan of care. Provide supportive counselling and education.  Medical Decision Making Problem Points:  Established problem, stable/improving (1), Review of last therapy session (1) and Review of psycho-social stressors (1) Data Points:  Review of medication regiment & side effects (2)  I certify that inpatient services furnished can reasonably be expected to improve the patient's condition.   Krisanne Lich 05/08/2012, 11:35 AM

## 2012-05-08 NOTE — Progress Notes (Signed)
Nutrition Brief Note  Patient identified on the Malnutrition Screening Tool (MST) Report  Body mass index is 26.13 kg/(m^2). Patient meets criteria for overweight based on current BMI.   Current diet order is regular, patient is consuming approximately good% of meals at this time. Labs and medications reviewed.   Patient reports poor nutritional intake secondary to ETOH use which worsened in the last month.  Wt hx:  140 lbs last summer which increased to 186 lbs until approximately January weight loss of 30 lbs since January.  Current wt=157 lbs.  (16 lb weight loss in the last 4 months.)  Discussed healthy nutrition, encouraged intake.  Will add Ensure Complete po BID, each supplement provides 350 kcal and 13 grams of protein for repletion.  Patient already receiving MVI daily.  Patient states that she is eating fairly well currently but is a slow eater and unable to finish meals here.  Is very hungry.  Meals per patient preference and snacks tid.  Provided patient "Sobriety Nutrition Therapy" handout.   Please consult for further nutrition issues arise, please consult RD.   Oran Rein, RD, LDN Clinical Inpatient Dietitian Pager:  [phone] Weekend and after hours pager:  [phone]

## 2012-05-08 NOTE — Progress Notes (Signed)
Patient ID: Rhonda Castro, female   DOB: [birth year], 37 y.o.   MRN: [id number] Patient's husband, Naara Kelty at [phone], returned call and is in favor of patient receiving treatment for her alcohol problems. Mr Brickel agreeable to [facility] contacting him regarding facilitating admit.  Husband also mentions planned move to [facility] should not interfere with patient's treatment as her getting well takes precedent.  Carney Bern, LCSWA

## 2012-05-09 ENCOUNTER — Encounter (HOSPITAL_COMMUNITY): Payer: Self-pay | Admitting: Psychiatry

## 2012-05-09 DIAGNOSIS — F411 Generalized anxiety disorder: Secondary | ICD-10-CM

## 2012-05-09 DIAGNOSIS — F329 Major depressive disorder, single episode, unspecified: Secondary | ICD-10-CM

## 2012-05-09 MED ORDER — IBUPROFEN 800 MG PO TABS
800.0000 mg | ORAL_TABLET | Freq: Four times a day (QID) | ORAL | Status: DC | PRN
  Administered 2012-05-09 – 2012-05-10 (×2): 800 mg via ORAL
  Filled 2012-05-09 (×2): qty 1

## 2012-05-09 NOTE — Progress Notes (Signed)
Psychoeducational Group Note  Date:  05/09/2012 Time:  0945 am  Group Topic/Focus:  Identifying Needs:   The focus of this group is to help patients identify their personal needs that have been historically problematic and identify healthy behaviors to address their needs.  Participation Level:  Active  Participation Quality:  Appropriate  Affect:  Appropriate  Cognitive:  Appropriate  Insight:  Developing/Improving  Engagement in Group:  Developing/Improving  Additional Comments:  Pt was very supportive and engaged in group  Andrena Mews 05/09/2012,3:09 PM

## 2012-05-09 NOTE — Progress Notes (Signed)
D Rhonda Castro is focused on how the vistaril ( that she took last night to help her sleep) has affected ehr today. She asks this Clinical research associate over and over and voer, " are my eyes ok...myalgias pupils feel very dilated...." She is sad, depressed and has a blunted affect.     A She completed her AM slef invnetory and on it she wrote she denied SI within the past 24 hrs, she rated her depression and hopelessness " 2 / 1 " and she stated her DC plan is to " stay active". She attends her groups on the 300 hall, she is engaged in her recovery and POC moves forward.    R Safety is in place.

## 2012-05-09 NOTE — BHH Group Notes (Signed)
[facility] Group Notes:  (Clinical Social Work)  05/09/2012     10-11AM  Summary of Progress/Problems:   The main focus of today's process group was for the patient to identify ways in which they have in the past sabotaged their own recovery. Motivational Interviewing was utilized to ask the group members what they get out of their substance use, and what reasons they may have for wanting to change.  The Stages of Change were explained using a handout, and patients identified where they currently are with regard to stages of change.  The patient expressed that she drinks alcohol because she is a bored and lonely housewife.  She and her husband used to just drink occasionally, "dust would collect on the bottle," but now she has been drinking 1/5th of vodka daily.  She stated that her husband has spent much of the last 9 years away on the job, and that they will soon be moving to [place] to be with him more permanently.  Her mother-in-law moved in during April, and she is taking care of the house and 3 children, so that enabled patient to isolate to her room and drink more.  She states that she is now in the Action stage of change, and during the preparation stage she actually purposefully drank all the liquor in the house.  The next step in her action stage is to go to [facility].  She was very irritable with a monopolizing patient and started to leave group, but CSW was able to convince her to stay.  Type of Therapy:  Group Therapy - Process   Participation Level:  Active  Participation Quality:  Attentive and Sharing  Affect:  Depressed and Irritable  Cognitive:  Oriented  Insight:  Engaged  Engagement in Therapy:  Engaged  Modes of Intervention:  Education, Teacher, English as a foreign language, Motivational Interviewing  Ambrose Mantle, LCSW 05/09/2012, 11:18 AM

## 2012-05-09 NOTE — Progress Notes (Signed)
[facility] MD Progress Note  05/09/2012 2:19 PM Rhonda Castro  MRN:  [id number] Subjective:  1/10 depression, 8/10 anxiety, sleep was good and feels rested, she complained of feeling light headed and pupil dilation after taking the Vistaril yesterday--symptoms are lessening but medication discontinued and placed on her allergy list per patient, eyes are still sensitive Diagnosis:   Axis I: Alcohol Abuse, Anxiety Disorder NOS and Depressive Disorder NOS Axis II: Deferred Axis III:  Past Medical History  Diagnosis Date  . Hypertension   . Alcohol abuse    Axis IV: other psychosocial or environmental problems, problems related to social environment and problems with primary support group Axis V: 41-50 serious symptoms  ADL's:  Intact  Sleep: Good  Appetite:  Fair  Suicidal Ideation:  Denies Homicidal Ideation:  Denies  Psychiatric Specialty Exam: Review of Systems  Constitutional: Negative.   HENT: Negative.   Eyes: Negative.   Respiratory: Negative.   Cardiovascular: Negative.   Gastrointestinal: Negative.   Genitourinary: Negative.   Musculoskeletal: Negative.   Skin: Negative.   Neurological: Negative.   Endo/Heme/Allergies: Negative.   Psychiatric/Behavioral: Positive for depression and substance abuse. The patient is nervous/anxious.     Blood pressure 126/83, pulse 67, temperature 98 F (36.7 C), temperature source Oral, resp. rate 16, height 5\' 5"  (1.651 m), weight 71.215 kg (157 lb), SpO2 100.00%.Body mass index is 26.13 kg/(m^2).  General Appearance: Casual  Eye Contact::  Fair  Speech:  Normal Rate  Volume:  Normal  Mood:  Anxious and Depressed  Affect:  Congruent  Thought Process:  Coherent  Orientation:  Full (Time, Place, and Person)  Thought Content:  WDL  Suicidal Thoughts:  No  Homicidal Thoughts:  No  Memory:  Immediate;   Fair Recent;   Fair Remote;   Fair  Judgement:  Fair  Insight:  Fair  Psychomotor Activity:  Decreased  Concentration:  Fair   Recall:  Fair  Akathisia:  No  Handed:  Right  AIMS (if indicated):     Assets:  Resilience Social Support  Sleep:  Number of Hours: 6.25   Current Medications: Current Facility-Administered Medications  Medication Dose Route Frequency Provider Last Rate Last Dose  . acetaminophen (TYLENOL) tablet 650 mg  650 mg Oral Q6H PRN Sanjuana Kava, NP      . alum & mag hydroxide-simeth (MAALOX/MYLANTA) 200-200-20 MG/5ML suspension 30 mL  30 mL Oral Q4H PRN Sanjuana Kava, NP      . feeding supplement (ENSURE COMPLETE) liquid 237 mL  237 mL Oral BID BM Jeoffrey Massed, RD      . magnesium hydroxide (MILK OF MAGNESIA) suspension 30 mL  30 mL Oral Daily PRN Sanjuana Kava, NP      . metoprolol (LOPRESSOR) tablet 50 mg  50 mg Oral BID Sanjuana Kava, NP   50 mg at 05/09/12 4098  . multivitamin with minerals tablet 1 tablet  1 tablet Oral Daily Sanjuana Kava, NP   1 tablet at 05/09/12 [phone]  . pantoprazole (PROTONIX) EC tablet 40 mg  40 mg Oral Daily Sanjuana Kava, NP   40 mg at 05/09/12 4782  . sertraline (ZOLOFT) tablet 25 mg  25 mg Oral Daily Himabindu Ravi, MD   25 mg at 05/09/12 0828  . thiamine (B-1) injection 100 mg  100 mg Intramuscular Once Sanjuana Kava, NP      . thiamine (VITAMIN B-1) tablet 100 mg  100 mg Oral Daily Sanjuana Kava, NP  100 mg at 05/09/12 0828  . traZODone (DESYREL) tablet 50 mg  50 mg Oral QHS PRN Sanjuana Kava, NP   50 mg at 05/07/12 2151    Lab Results: No results found for this or any previous visit (from the past 48 hour(s)).  Physical Findings: AIMS: Facial and Oral Movements Muscles of Facial Expression: None, normal Lips and Perioral Area: None, normal Jaw: None, normal Tongue: None, normal,Extremity Movements Upper (arms, wrists, hands, fingers): None, normal Lower (legs, knees, ankles, toes): None, normal, Trunk Movements Neck, shoulders, hips: None, normal, Overall Severity Severity of abnormal movements (highest score from questions above): None,  normal Incapacitation due to abnormal movements: None, normal Patient's awareness of abnormal movements (rate only patient's report): No Awareness, Dental Status Current problems with teeth and/or dentures?: Yes Does patient usually wear dentures?: Yes (pt did not bring in)  CIWA:  CIWA-Ar Total: 3 COWS:     Treatment Plan Summary: Daily contact with patient to assess and evaluate symptoms and progress in treatment Medication management  Plan:  Review of chart, vital signs, medications, and notes. 1-Individual and group therapy 2-Medication management for depression and anxiety:  Medications reviewed with the patient and she stated she had felt "odd" after her vistaril and felt like she was having an allergic reaction--Vistaril discontinued 3-Coping skills for depression and anxeity 4-Continue crisis stabilization and management 5-Address health issues--monitoring vital signs, stable 6-Treatment plan in progress to prevent relapse of depression, alcohol abuse, and anxiety  Medical Decision Making Problem Points:  Established problem, stable/improving (1) and Review of psycho-social stressors (1) Data Points:  Review of medication regiment & side effects (2)  I certify that inpatient services furnished can reasonably be expected to improve the patient's condition.   Nanine Means, PMH-NP 05/09/2012, 2:19 PM

## 2012-05-09 NOTE — Progress Notes (Signed)
Adult Psychoeducational Group Note  Date:  05/09/2012 Time:  1:15PM Group Topic/Focus:  Coping With Mental Health Crisis:   The purpose of this group is to help patients identify strategies for coping with mental health crisis.  Group discusses possible causes of crisis and ways to manage them effectively.  Participation Level:  Did Not Attend   Additional Comments:  Pt didn't attend group.   Bing Plume D 05/09/2012, 3:04 PM

## 2012-05-09 NOTE — [facility] Group Notes (Signed)
Pt attended self inventory group and participated appropriately

## 2012-05-09 NOTE — Progress Notes (Signed)
D.  Pt has interacted appropriately on unit this evening, she is programming on the 300 hall.  Positive for evening group, see group notes.  Denies complaints at this time.  Denies SI/HI/hallucinations at this time.  A.  Support and encouragement offered R.  Pt remains safe on unit, will continue to monitor.

## 2012-05-10 MED ORDER — CEPHALEXIN 500 MG PO CAPS
500.0000 mg | ORAL_CAPSULE | Freq: Two times a day (BID) | ORAL | Status: DC
  Administered 2012-05-10: 500 mg via ORAL
  Filled 2012-05-10 (×3): qty 1

## 2012-05-10 MED ORDER — TRAMADOL HCL 50 MG PO TABS
50.0000 mg | ORAL_TABLET | ORAL | Status: DC | PRN
  Administered 2012-05-10 – 2012-05-11 (×4): 50 mg via ORAL
  Filled 2012-05-10 (×4): qty 1

## 2012-05-10 MED ORDER — SULFAMETHOXAZOLE-TMP DS 800-160 MG PO TABS
1.0000 | ORAL_TABLET | Freq: Two times a day (BID) | ORAL | Status: DC
  Administered 2012-05-10 – 2012-05-11 (×3): 1 via ORAL
  Filled 2012-05-10 (×7): qty 1

## 2012-05-10 MED ORDER — MUPIROCIN CALCIUM 2 % EX CREA
TOPICAL_CREAM | Freq: Three times a day (TID) | CUTANEOUS | Status: DC
  Administered 2012-05-11: 09:00:00 via TOPICAL
  Filled 2012-05-10: qty 15

## 2012-05-10 NOTE — Progress Notes (Signed)
Adult Psychoeducational Group Note  Date:  05/10/2012 Time:  8:01 PM  Group Topic/Focus:  Conflict Resolution:   The focus of this group is to discuss the conflict resolution process and how it may be used upon discharge.  Participation Level:  Did Not Attend  Participation Quality:  Inattentive  Affect:  Appropriate  Cognitive:  Appropriate  Insight: None  Engagement in Group:  None  Modes of Intervention:  Problem-solving  Additional Comments:  Patient attended group on a different hall.   Lyndee Hensen 05/10/2012, 8:01 PM

## 2012-05-10 NOTE — Progress Notes (Signed)
Psychoeducational Group Note  Date:  05/10/2012 Time:  0945 am  Group Topic/Focus:  Making Healthy Choices:   The focus of this group is to help patients identify negative/unhealthy choices they were using prior to admission and identify positive/healthier coping strategies to replace them upon discharge.  Participation Level:  Active  Participation Quality:  Appropriate, Sharing and Supportive  Affect:  Appropriate  Cognitive:  Alert and Appropriate  Insight:  Engaged  Engagement in Group:  Engaged  Additional Comments:    Andrena Mews 05/10/2012, 10:29 AM

## 2012-05-10 NOTE — Progress Notes (Signed)
D Bricia reports that she  Is feeling some better today.Marland KitchenMarland KitchenMarland KitchenShe says she slept much better last night, she denies SI and she rates her depression and hopelessness " 7/10".   A She requested and was given ibuprofen for c/o headache and she stated she received relief afterwards.   R She is encouraged to pursue and develop DC plan for her f/u care and she is working towards this.Therapeutic relationship is fostered.

## 2012-05-10 NOTE — Progress Notes (Signed)
Patient did attend the evening speaker AA meeting.  

## 2012-05-10 NOTE — Progress Notes (Signed)
Reviewed the information documented and agree with the treatment plan.  Markasia Carrol,JANARDHAHA R. 05/10/2012 1:30 PM

## 2012-05-10 NOTE — Progress Notes (Addendum)
Patient ID: Rhonda Castro, female   DOB: [birth year], 37 y.o.   MRN: [id number] S-Asked to see pt for c/o pain related to buttock sores and history of hidradenitis.Pt c/o painful 2cm lt buttocks absess as well as one constantly draining abscess medially.She has bilateral hidradenitis x 15 yrs. She states warm soaks tend to flare her .She is intolerant of daily doxycycline because of photosensitivity.Pt relates pain 6/10.She is currently on keflex.She denies any hx of MRSA  O- In the mid left buttock there is a 2 cm subcutaneous abscess that is tender to palpation and scabbed over.At 7 oclock to this abscess she has a similar abscess that is non tender.Medially at 3 o'clock she has a drianing 1-2cm abscess. The rt buttock as well as the lt has the typical appearance of hidradenitis infections with scarring and pock marks as well as indolent hyperpigmented nodules.  A-Hidradenitis of buttocks with acute abscess of lt buttocks  P- Culture for C&S lt buttock sore and draining abscess      D/C Keflex      Start Septra DS 1 po bid x 10 days      Change dressings TID/ apply bactroban with dressing changes      Tramadol 50 mg 1-2 qid prn x 5 days for pain not relieved by ibuprofen/tylenol       FU with PCP s/p discharge

## 2012-05-10 NOTE — Progress Notes (Signed)
D

## 2012-05-10 NOTE — Progress Notes (Signed)
Patient ID: Rhonda Castro, female   DOB: 07-Aug-1975, 37 y.o.   MRN: [id number] [facility] MD Progress Note  05/10/2012 3:55 PM Rhonda Castro  MRN:  [id number] Subjective:  "I'm up and down today." and "my eyes are dilated, and won't change, I look loopy, like I"m over medicated." and "I have this place on my butt." Objective: Patient notes her sleep was good, appetite is fine, but rates her depression as a 10/10, and her anxiety as a 10/10. Reports this morning she was irritable and sensitive to loud noises. Also is concerned about a "boil" reporting hyperadenitis.  Diagnosis:   Axis I: Alcohol Abuse, Anxiety Disorder NOS and Depressive Disorder NOS Axis II: Deferred Axis III:  Past Medical History  Diagnosis Date  . Hypertension   . Alcohol abuse    Axis IV: other psychosocial or environmental problems, problems related to social environment and problems with primary support group Axis V: 41-50 serious symptoms  ADL's:  Intact  Sleep: Good  Appetite:  fine  Suicidal Ideation:  Denies Homicidal Ideation:  Denies  Psychiatric Specialty Exam: Review of Systems  Constitutional: Negative.   HENT: Negative.   Eyes: Positive for photophobia.  Respiratory: Negative.   Cardiovascular: Negative.   Gastrointestinal: Negative.   Genitourinary: Negative.   Musculoskeletal: Negative.   Skin: Positive for rash ("Boil on my butt.").  Neurological: Negative.   Endo/Heme/Allergies: Negative.   Psychiatric/Behavioral: Positive for depression and substance abuse. The patient is nervous/anxious.     Blood pressure 135/86, pulse 81, temperature 97.4 F (36.3 C), temperature source Oral, resp. rate 16, height 5\' 5"  (1.651 m), weight 71.215 kg (157 lb), SpO2 100.00%.Body mass index is 26.13 kg/(m^2).  General Appearance: Casual  Eye Contact::  Fair  Speech:  Normal Rate  Volume:  Normal  Mood:  Anxious and Depressed 10/10  Affect:  Congruent anxiety 10/10  Thought Process:  Coherent   Orientation:  Full (Time, Place, and Person)  Thought Content:  WDL  Suicidal Thoughts:  No  Homicidal Thoughts:  No  Memory:  Immediate;   Fair Recent;   Fair Remote;   Fair  Judgement:  Fair  Insight:  Fair  Psychomotor Activity:  Decreased  Concentration:  Fair  Recall:  Fair  Akathisia:  No  Handed:  Right  AIMS (if indicated):     Assets:  Resilience Social Support  Sleep:  Number of Hours: 6.25   Current Medications: Current Facility-Administered Medications  Medication Dose Route Frequency Provider Last Rate Last Dose  . acetaminophen (TYLENOL) tablet 650 mg  650 mg Oral Q6H PRN Sanjuana Kava, NP      . alum & mag hydroxide-simeth (MAALOX/MYLANTA) 200-200-20 MG/5ML suspension 30 mL  30 mL Oral Q4H PRN Sanjuana Kava, NP      . feeding supplement (ENSURE COMPLETE) liquid 237 mL  237 mL Oral BID BM Jeoffrey Massed, RD      . ibuprofen (ADVIL,MOTRIN) tablet 800 mg  800 mg Oral Q6H PRN Nanine Means, NP   800 mg at 05/10/12 1247  . magnesium hydroxide (MILK OF MAGNESIA) suspension 30 mL  30 mL Oral Daily PRN Sanjuana Kava, NP      . metoprolol (LOPRESSOR) tablet 50 mg  50 mg Oral BID Sanjuana Kava, NP   50 mg at 05/10/12 0841  . multivitamin with minerals tablet 1 tablet  1 tablet Oral Daily Sanjuana Kava, NP   1 tablet at 05/10/12 0841  . pantoprazole (PROTONIX) EC tablet 40  mg  40 mg Oral Daily Sanjuana Kava, NP   40 mg at 05/10/12 0841  . sertraline (ZOLOFT) tablet 25 mg  25 mg Oral Daily Himabindu Ravi, MD   25 mg at 05/10/12 0841  . thiamine (B-1) injection 100 mg  100 mg Intramuscular Once Sanjuana Kava, NP      . thiamine (VITAMIN B-1) tablet 100 mg  100 mg Oral Daily Sanjuana Kava, NP   100 mg at 05/10/12 0841  . traZODone (DESYREL) tablet 50 mg  50 mg Oral QHS PRN Sanjuana Kava, NP   50 mg at 05/07/12 2151    Lab Results: No results found for this or any previous visit (from the past 48 hour(s)).  Physical Findings:   Pupils ERRLA on exam: Conjunctiva/Sclera is  clear.                                    Skin: patient has a 4mm area that is red and swollen, with white drainage from the center with loss of first layer of skin, no induration.                                             C/w pyogenic granuloma AIMS: Facial and Oral Movements Muscles of Facial Expression: None, normal Lips and Perioral Area: None, normal Jaw: None, normal Tongue: None, normal,Extremity Movements Upper (arms, wrists, hands, fingers): None, normal Lower (legs, knees, ankles, toes): None, normal, Trunk Movements Neck, shoulders, hips: None, normal, Overall Severity Severity of abnormal movements (highest score from questions above): None, normal Incapacitation due to abnormal movements: None, normal Patient's awareness of abnormal movements (rate only patient's report): No Awareness, Dental Status Current problems with teeth and/or dentures?: Yes Does patient usually wear dentures?: Yes (pt did not bring in)  CIWA:  CIWA-Ar Total: 0 COWS:     Treatment Plan Summary: Daily contact with patient to assess and evaluate symptoms and progress in treatment Medication management  Plan:  Review of chart, vital signs, medications, and notes. 1-Individual and group therapy 2-Medication management for depression and anxiety:  Medications reviewed with the patient and she stated she had felt "odd" after her vistaril and felt like she was having an allergic reaction--Vistaril discontinued 3-Coping skills for depression and anxeity 4-Continue crisis stabilization and management 5-Address health issues--monitoring vital signs, stable 6-Treatment plan in progress to prevent relapse of depression, alcohol abuse, and anxiety 7. Recommended Keflex 500mg  po BID for lesion to left buttock, with soaks, and bandage changes. 8 She is anticipating [facility]. Medical Decision Making Problem Points:  Established problem, stable/improving (1), New problem, with  no additional work-up planned (3) and Review of psycho-social stressors (1) Data Points:  Review of medication regiment & side effects (2)  I certify that inpatient services furnished can reasonably be expected to improve the patient's condition.  Rona Ravens. Eliseo Withers RPAC 4:08 PM 05/10/2012

## 2012-05-10 NOTE — [facility] Group Notes (Signed)
[facility] Group Notes:  (Clinical Social Work)  05/10/2012  10:00-11:00AM  Summary of Progress/Problems:   The main focus of today's process group was to   identify the patient's current support system and decide on other supports that can be put in place.  Four definitions/levels of support were discussed and an exercise was utilized to show how much stronger we become with additional supports.  An emphasis was placed on using counselor, doctor, therapy groups, 12-step groups, and problem-specific support groups to expand supports, as well as doing something different than has been done before. The patient expressed a willingness to add a therapist, a doctor, and AA groups in addition to her mother, sister, and mother-in-law.  She also talked about how she has heard other people say to get sober for her children, but she recognizes she has to do this for herself.  She stated, "I don't want to be mean, but as far as I'm concerned I'm the only person in this room."  Others understood her statements, and supported her in this.  Type of Therapy:  Process Group with Motivational Interviewing  Participation Level:  Active  Participation Quality:  Attentive, Sharing and Supportive  Affect:  Blunted  Cognitive:  Alert, Appropriate and Oriented  Insight:  Engaged  Engagement in Therapy:  Engaged  Modes of Intervention:   Education, Support and Processing, Activity  [employer], LCSW 05/10/2012, 5:09 PM

## 2012-05-11 MED ORDER — POTASSIUM CHLORIDE CRYS ER 10 MEQ PO TBCR: 10.0000 meq | EXTENDED_RELEASE_TABLET | Freq: Two times a day (BID) | ORAL | Status: DC

## 2012-05-11 MED ORDER — DIPHENHYDRAMINE HCL 25 MG PO CAPS
50.0000 mg | ORAL_CAPSULE | Freq: Four times a day (QID) | ORAL | Status: DC | PRN
  Administered 2012-05-11: 50 mg via ORAL
  Administered 2012-05-12: 25 mg via ORAL

## 2012-05-11 MED ORDER — POTASSIUM CHLORIDE CRYS ER 10 MEQ PO TBCR
10.0000 meq | EXTENDED_RELEASE_TABLET | Freq: Two times a day (BID) | ORAL | Status: DC
  Administered 2012-05-11 – 2012-05-12 (×2): 10 meq via ORAL
  Filled 2012-05-11 (×4): qty 1

## 2012-05-11 MED ORDER — MUPIROCIN CALCIUM 2 % EX CREA
TOPICAL_CREAM | Freq: Every day | CUTANEOUS | Status: DC
  Administered 2012-05-12: 08:00:00 via TOPICAL
  Filled 2012-05-11: qty 15

## 2012-05-11 NOTE — Progress Notes (Signed)
Patient ID: Rhonda Castro, female   DOB: 1975-07-16, 37 y.o.   MRN: [id number] D-Patient reports poor sleep and improving appetite.  Her energy level is low and her ability to pay attention is poor.  She says she wants to be more active and she denies thoughts of self harm. Culture done of infected sweat glands on buttocks.  Dressing changed after patient showered.  She is attending groups and is looking forward to going [facility] tomorrow tomorrow after culture results in.

## 2012-05-11 NOTE — Progress Notes (Signed)
[facility] MD Progress Note  05/11/2012 1:43 PM Rhonda Castro  MRN:  [id number] Subjective:  2-3/10 depression with no suicidal ideations, anxiety average to high but patient states she feels more excited about getting rehab and counseling afterwards for her issues--vested in treatment and wants recovery from alcohol and her depression.  She has 3 infected sebaceous glands on her left buttock and on the posterior neck, antibiotics and dressing change orders in place. Diagnosis:   Axis I: Alcohol Abuse, Anxiety Disorder NOS and Major Depression, Recurrent severe Axis II: Deferred Axis III:  Past Medical History  Diagnosis Date  . Hypertension   . Alcohol abuse    Axis IV: economic problems, other psychosocial or environmental problems, problems related to social environment and problems with primary support group Axis V: 41-50 serious symptoms  ADL's:  Intact  Sleep: Poor  Appetite:  Fair  Suicidal Ideation:  Denies Homicidal Ideation:  Denies  Psychiatric Specialty Exam: Review of Systems  Constitutional: Negative.   HENT: Negative.   Eyes: Negative.   Respiratory: Negative.   Cardiovascular: Negative.   Gastrointestinal: Negative.   Genitourinary: Negative.   Musculoskeletal: Negative.   Skin:       Patient has an infected sebaceous gland on the back of her neck and three on her right buttocks, chronic condition  Neurological: Negative.   Endo/Heme/Allergies: Negative.   Psychiatric/Behavioral: Positive for depression and substance abuse. The patient is nervous/anxious.     Blood pressure 120/81, pulse 53, temperature 97.7 F (36.5 C), temperature source Oral, resp. rate 16, height 5\' 5"  (1.651 m), weight 71.215 kg (157 lb), SpO2 100.00%.Body mass index is 26.13 kg/(m^2).  General Appearance: Casual  Eye Contact::  Fair  Speech:  Normal Rate  Volume:  Normal  Mood:  Anxious and Depressed  Affect:  Congruent  Thought Process:  Coherent  Orientation:  Full (Time, Place,  and Person)  Thought Content:  WDL  Suicidal Thoughts:  No  Homicidal Thoughts:  No  Memory:  Immediate;   Fair Recent;   Fair Remote;   Fair  Judgement:  Fair  Insight:  Fair  Psychomotor Activity:  Decreased  Concentration:  Fair  Recall:  Fair  Akathisia:  No  Handed:  Right  AIMS (if indicated):     Assets:  Communication Skills Resilience Social Support  Sleep:  Number of Hours: 5.25   Current Medications: Current Facility-Administered Medications  Medication Dose Route Frequency Provider Last Rate Last Dose  . acetaminophen (TYLENOL) tablet 650 mg  650 mg Oral Q6H PRN Sanjuana Kava, NP      . alum & mag hydroxide-simeth (MAALOX/MYLANTA) 200-200-20 MG/5ML suspension 30 mL  30 mL Oral Q4H PRN Sanjuana Kava, NP      . feeding supplement (ENSURE COMPLETE) liquid 237 mL  237 mL Oral BID BM Jeoffrey Massed, RD   237 mL at 05/10/12 1000  . ibuprofen (ADVIL,MOTRIN) tablet 800 mg  800 mg Oral Q6H PRN Nanine Means, NP   800 mg at 05/10/12 1247  . magnesium hydroxide (MILK OF MAGNESIA) suspension 30 mL  30 mL Oral Daily PRN Sanjuana Kava, NP      . metoprolol (LOPRESSOR) tablet 50 mg  50 mg Oral BID Sanjuana Kava, NP   50 mg at 05/11/12 0848  . multivitamin with minerals tablet 1 tablet  1 tablet Oral Daily Sanjuana Kava, NP   1 tablet at 05/11/12 0848  . [START ON 05/12/2012] mupirocin cream (BACTROBAN) 2 %  Topical Daily Nanine Means, NP      . pantoprazole (PROTONIX) EC tablet 40 mg  40 mg Oral Daily Sanjuana Kava, NP   40 mg at 05/11/12 0848  . potassium chloride (K-DUR,KLOR-CON) CR tablet 10 mEq  10 mEq Oral BID Nanine Means, NP      . sertraline (ZOLOFT) tablet 25 mg  25 mg Oral Daily Jamayah Myszka, MD   25 mg at 05/11/12 0848  . sulfamethoxazole-trimethoprim (BACTRIM DS) 800-160 MG per tablet 1 tablet  1 tablet Oral Q12H Court Joy, PA-C   1 tablet at 05/11/12 0848  . thiamine (B-1) injection 100 mg  100 mg Intramuscular Once Sanjuana Kava, NP      . thiamine (VITAMIN B-1)  tablet 100 mg  100 mg Oral Daily Sanjuana Kava, NP   100 mg at 05/11/12 0848  . traMADol (ULTRAM) tablet 50 mg  50 mg Oral Q4H PRN Rachael Fee, MD   50 mg at 05/11/12 0941  . traZODone (DESYREL) tablet 50 mg  50 mg Oral QHS PRN Sanjuana Kava, NP   50 mg at 05/07/12 2151    Lab Results: No results found for this or any previous visit (from the past 48 hour(s)).  Physical Findings: AIMS: Facial and Oral Movements Muscles of Facial Expression: None, normal Lips and Perioral Area: None, normal Jaw: None, normal Tongue: None, normal,Extremity Movements Upper (arms, wrists, hands, fingers): None, normal Lower (legs, knees, ankles, toes): None, normal, Trunk Movements Neck, shoulders, hips: None, normal, Overall Severity Severity of abnormal movements (highest score from questions above): None, normal Incapacitation due to abnormal movements: None, normal Patient's awareness of abnormal movements (rate only patient's report): No Awareness, Dental Status Current problems with teeth and/or dentures?: Yes Does patient usually wear dentures?: Yes (pt did not bring in)  CIWA:  CIWA-Ar Total: 0 COWS:     Treatment Plan Summary: Daily contact with patient to assess and evaluate symptoms and progress in treatment Medication management  Plan:  Review of chart, vital signs, medications, and notes. 1-Individual and group therapy 2-Medication management for depression and anxiety:  Medications reviewed with the patient and she stated no untoward effects 3-Coping skills for depression, anxiety, and pain related to her infected glands---dressing change order changed to daily after shower, antibiotic regiment in place--culture sent for analysis 4-Continue crisis stabilization and management 5-Address health issues--monitoring vital signs, stable 6-Treatment plan in progress to prevent relapse of depression, alcohol abuse and anxiety  Medical Decision Making Problem Points:  Established problem,  stable/improving (1) and Review of psycho-social stressors (1) Data Points:  Review of new medications or change in dosage (2)  I certify that inpatient services furnished can reasonably be expected to improve the patient's condition.   Nanine Means, PMH-NP 05/11/2012, 1:43 PM

## 2012-05-11 NOTE — Progress Notes (Signed)
Patient ID: Rhonda Castro, female   DOB: 11-12-1975, 37 y.o.   MRN: [id number] D: pt. In bed reports "tired", thinks she may have had a reaction to pain med last night and didn't sleep well. No c/o of reactions at this time. Pt. Reports she is to be leaving tomorrow for [facility] "looking forward to it". A: Writer introduced self to client, encouraged group. Pt. Will be monitored by staff q81min. Writer assessed buttock wounds, no drainage note dressings dry and intact. R: Pt. Is safe on the unit. Pt. Refused group tonight.

## 2012-05-11 NOTE — Progress Notes (Signed)
Patient ID: Rhonda Castro, female   DOB: 02-Apr-1975, 37 y.o.   MRN: [id number] [facility] was called this morning and they agree to hold bed for patient one day as we await test results. Test results need to be faxed to [facility] at [phone] as soon as we receive. Patient will need to discharge by 1:30 in order to get to [facility] for 2:30 PM admit on 05/12/12.  Carney Bern, LCSWA

## 2012-05-11 NOTE — Progress Notes (Signed)
D: Pt is bright in affect and anxious in mood. Pt main concern for the evening is having an alternative pain med for her boil located on her left buttocks. At this time the boil presents with no drainage. However, the location of the boil is near the fold of her buttocks and therefore presents with increased pain as she is walking. This writer has placed a dry dressing on this pt's boil. PA on call notified of pt's request. PA personally assessed wound and initiated a small amount of drainage to perform a culture. A culture and sensitivity was ordered for this pt. PA has ended pt's Keflex order and started pt on Septra DS BID for 10 days. PA has also ordered Ultram 50 mg 1-2 tabs q4hr prn  For pain for 5 days. TID dressing changes with Bactroban has been ordered as well for this pt..... Pt is also reporting that she is having some anxiety about being discharged to [facility] as her new destination and also a little sad to be departing away from some of the great bonds she acquired with others during her inpatient stay.   A: Producer, television/film/video and Ultram. Dressing previously wrapped after the conclusion of PA assessment. Continued support and availability as needed was extended to this pt. Writer wished pt the best in her tx at [facility]. Staff continue to monitor pt with q9min checks. R: Pt receptive to new care for wound. Pt anxiety has been decreased with writers words of encouragement. Pt remains safe at this time.

## 2012-05-11 NOTE — Progress Notes (Addendum)
Recreation Therapy Notes   Date: 05.05.2014  Time: 3:00pm  Location: [address] Dayroom   Group Topic/Focus: Self Expression   Participation Level:  Active   Participation Quality:  Appropriate   Affect:  Euthymic  Cognitive:  Oriented   Additional Comments: Activity: The faces of me; Explanation: Patients were asked to draw their face in two parts. One side of their face represented how they currently see themselves. The other side of the face is how they think they will see themselves upon discharge. Classical music was played in the background to enhance they therapeutic space. Patients were given the choice of pencil, color pencil or markers to complete their drawing.   Patient actively participated in group activity. Patient drew a face in green and pink on her paper. The left side of the face was frowning a crying. Patient stated she drew this side in green because when she arrived to [facility] she was sick. On the right side of the face was smiling and drawn in pink. Patient stated she feels much better since being admitting to [facility] and she is ready to go home. Patient asked to keep her drawing because she wanted to give it to her mother.   Marykay Lex Deaunte Dente, LRT/CTRS  Ilani Otterson L 05/11/2012 5:15 PM

## 2012-05-11 NOTE — [facility] Group Notes (Signed)
[facility] LCSW Aftercare Discharge Planning Group Note   05/11/2012  8:45 AM  Participation Quality:  Appropriate  Mood/Affect:  Depressed  Depression Rating:  2-3  Anxiety Rating:  5-6  Thoughts of Suicide:  No Will you contract for safety?   NA  Current AVH:  No  Plan for Discharge/Comments:  Admit to [facility] was scheduled today; CSW will need to check in re medical complication  Transportation Means:  Family  Supports: Mother, sister, husband and children  Dyane Dustman, Julious Payer

## 2012-05-11 NOTE — [facility] Group Notes (Signed)
[facility] LCSW Group Therapy  05/11/2012  1:15 PM  Type of Therapy:  Group Therapy  Participation Level:  Active  Participation Quality:  Appropriate  Affect:  Appropriate  Cognitive:  Appropriate  Insight:  Developing/Improving  Engagement in Therapy:  Engaged  Modes of Intervention:  Discussion, Education, Socialization and Support  Summary of Progress/Problems:  Group discussion focused on what patient's see as their own obstacles to recovery.  Patient shares belief that "loneliness and lack of a purpose" will be difficult to deal.  Patient was later able to process that she often isolates her self (lonliness) in order to 'protect my drinking.'  Britanee was also able to process that she allowed others to take over in certain areas for her such as "chuildcare by mother in law and mowing duties by son as a way to focus more time on my drinking and then got mad at them for taking my purpose."  Jaydan offered support to others in group   Clide Dales 05/11/2012, 3:04 PM

## 2012-05-12 MED ORDER — SULFAMETHOXAZOLE-TMP DS 800-160 MG PO TABS: 1.0000 | ORAL_TABLET | Freq: Two times a day (BID) | ORAL | Status: DC

## 2012-05-12 MED ORDER — OMEPRAZOLE 10 MG PO CPDR: 20.0000 mg | DELAYED_RELEASE_CAPSULE | Freq: Two times a day (BID) | ORAL | Status: DC

## 2012-05-12 MED ORDER — TRAZODONE HCL 50 MG PO TABS: 50.0000 mg | ORAL_TABLET | Freq: Every evening | ORAL | Status: DC | PRN

## 2012-05-12 MED ORDER — METOPROLOL TARTRATE 50 MG PO TABS: 50.0000 mg | ORAL_TABLET | Freq: Two times a day (BID) | ORAL | Status: DC

## 2012-05-12 MED ORDER — SERTRALINE HCL 25 MG PO TABS: 25.0000 mg | ORAL_TABLET | Freq: Every day | ORAL | Status: DC

## 2012-05-12 MED ORDER — POTASSIUM CHLORIDE CRYS ER 10 MEQ PO TBCR: 10.0000 meq | EXTENDED_RELEASE_TABLET | Freq: Two times a day (BID) | ORAL | Status: DC

## 2012-05-12 NOTE — Progress Notes (Signed)
Patient ID: Rhonda Castro, female   DOB: 07-12-1975, 37 y.o.   MRN: [id number] Patient was discharged ambulatory to ride with mother to [facility] for further treatment.  She denies SI/HI.  She verbalizes understanding of her discharge meds and follow up.  She is hopeful about the future and pressing appreciation for care received here.  Pt is hoping that her admission will be today,  but since a lab result is pending she has an alternate plan of staying with her mother tonight.

## 2012-05-12 NOTE — Progress Notes (Signed)
[facility] Adult Case Management Discharge Plan :  Will you be returning to the same living situation after discharge: No. Patient going to 28 day inpatient program at [facility] At discharge, do you have transportation home?:Yes,  mother Do you have the ability to pay for your medications:Yes,  insurance copay is doable  Release of information consent forms completed and in the chart;  Patient's signature needed at discharge.  Patient to Follow up at: Follow-up Information   Follow up with [facility] On 05/12/2012. (Check in today at 2:30 PM )    Contact information:   [address] Otho Perl  [place] [address] [facility] [phone] Valinda Hoar [phone]      Patient denies SI/HI:   Yes,  denies both    Safety Planning and Suicide Prevention discussed:  No. NA. Patient was not suicidal at admit nor during stay.   Clide Dales 05/12/2012, 12:37 PM

## 2012-05-12 NOTE — Discharge Summary (Signed)
Physician Discharge Summary Note  Patient:  Rhonda Castro is an 37 y.o., female MRN:  [id number] DOB:  1975/05/20 Patient phone:  [phone] (home)  Patient address:   [address] [place] [address],   Date of Admission:  05/06/2012 Date of Discharge: 05/12/12  Reason for Admission:  Alcohol abuse and Depression  Discharge Diagnoses: Principal Problem:   Alcohol abuse Active Problems:   Pyogenic abscess  Review of Systems  Constitutional: Negative.   HENT: Negative.   Eyes: Negative.   Respiratory: Negative.   Cardiovascular: Negative.   Gastrointestinal: Negative.   Genitourinary: Negative.   Musculoskeletal: Negative.   Skin: Negative.   Neurological: Negative.   Endo/Heme/Allergies: Negative.   Psychiatric/Behavioral: Positive for depression and substance abuse. Negative for suicidal ideas, hallucinations and memory loss. The patient is not nervous/anxious and does not have insomnia.    Axis Diagnosis:   AXIS I:  Alcohol Abuse, Anxiety Disorder NOS and Major Depression, Recurrent severe AXIS II:  Deferred AXIS III:   Past Medical History  Diagnosis Date  . Hypertension   . Alcohol abuse    AXIS IV:  economic problems, other psychosocial or environmental problems, problems related to social environment and problems with primary support group AXIS V:  61-70 mild symptoms  Level of Care:  OP  Hospital Course:  Rhonda Castro is an 37 y.o. female that was assessed this day. Pt requesting detox from alcohol. Pt stated she has been drinking 1/5 up to 3 pints per day of liquor for several years. Pt stated her PCP prescribed Prozac several years ago for depression and she got "such a buzz" from having one drink, that she continued drinking and "now it is out of control." Pt stated she quit taking the Prozac years ago and has not had any previous MH or SA treatment before. Pt admits to sx of depression and anxiety, reporting neurovegetative sx of staying in  her room, not coming out, not bathing or grooming herself. Pt's mother-in-law has had to come in to take care of the children, as pt has been almost incapacitated from the drinking. Pt denies SI, HI or psychosis. Pt has never tried to harm herself before. Pt stated she wants detox because "I can't live like this anymore." Pt stated she is hiding alcohol and visits the liquor store daily. Pt reported last use was 1/5 + one half of 1/5 (or 3 pints) liquor. Pt has lost 30 lbs in 6 mos and has not been sleeping at night, but that she blacks out daily at night and sleeps during the day. Pt denies hx of seizures. Pt reports current withdrawal sx are shakiness, nausea, anxiety, restlessness. Pt is not on any psychotropic medications. Pt stated she takes her medication for HBP as prescribed. Pt is motivated for treatment. Pt's mother is with pt and is also supportive. Pt's spouse is also supportive, but is out of town a great deal for his job and has been gone since August this time.      The duration of stay was six days. The patient was seen and evaluated by the Treatment team consisting of Psychiatrist, NP-C, RN, Case Manager, and Therapist for evaluation and treatment plan with goal of stabilization upon discharge. The patient's physical and mental health problems were identified and treated appropriately.      Multiple modalities of treatment were used including medication, individual and group therapies, unit programming, improved nutrition, physical activity, and family sessions as needed. Patient was not taking  any medication for her mental health prior to admission. Rhonda Castro was started on Zoloft 25 mg to help treat her depressive symptoms that were driving her use of alcohol. Patient also completed the librium detox protocol during her stay with a decline in report of withdrawal symptoms during this process. The medication trazodone 50 mg was initiated to help with sleep and also depressive symptoms. Rhonda Castro  tolerated these medications with no report of side effects.  An infected gland on patient's left buttock was treated with a course of Bactrim and antibiotic ointment. A wound culture of this area showed no growth prior to her discharge.      The symptoms of depression and alcohol withdrawal were monitored daily by evaluation by clinical provider.  The patient's mental and emotional status was evaluated by a daily self inventory completed by the patient. Patient actively attended groups and learned healthy coping skills during her stay. She appeared very motivated to become sober for her family. She also expressed interest in developing more hobbies and possibly obtaining a part time job. Patient felt that not having a purpose outside of her family had affected her negatively.       Improvement was demonstrated by declining numbers on the self assessment, improving vital signs, increased cognition, and improvement in mood, sleep, appetite as well as a reduction in physical symptoms.       The patient was evaluated and found to be stable enough for discharge and was referred to [facility] to continue her treatment. Patient denied any SI at discharge and was able to express hope for her future. Patient received prescriptions for her medications including for the rest of her antibiotic.   Mental Status Exam:  For mental status exam please see mental status exam and  suicide risk assessment completed by attending physician prior to discharge.     Consults:  None  Significant Diagnostic Studies:  None  Discharge Vitals:   Blood pressure 117/80, pulse 62, temperature 98 F (36.7 C), temperature source Oral, resp. rate 18, height 5\' 5"  (1.651 m), weight 71.215 kg (157 lb), SpO2 100.00%. Body mass index is 26.13 kg/(m^2). Lab Results:   Results for orders placed during the hospital encounter of 05/06/12 (from the past 72 hour(s))  WOUND CULTURE     Status: None   Collection Time    05/11/12  8:19 AM       Result Value Range   Specimen Description BUTTOCKS INFECTED SWEAT GLANDS LEFT BUTTOCKS     Special Requests Normal     Gram Stain PENDING     Culture NO GROWTH 1 DAY     Report Status PENDING      Physical Findings: AIMS: Facial and Oral Movements Muscles of Facial Expression: None, normal Lips and Perioral Area: None, normal Jaw: None, normal Tongue: None, normal,Extremity Movements Upper (arms, wrists, hands, fingers): None, normal Lower (legs, knees, ankles, toes): None, normal, Trunk Movements Neck, shoulders, hips: None, normal, Overall Severity Severity of abnormal movements (highest score from questions above): None, normal Incapacitation due to abnormal movements: None, normal Patient's awareness of abnormal movements (rate only patient's report): No Awareness, Dental Status Current problems with teeth and/or dentures?: Yes Does patient usually wear dentures?: Yes  CIWA:  CIWA-Ar Total: 0 COWS:     Psychiatric Specialty Exam: See Psychiatric Specialty Exam and Suicide Risk Assessment completed by Attending Physician prior to discharge.  Discharge destination:  Other:  [facility]  Is patient on multiple antipsychotic therapies at discharge:  No   Has Patient had three or more failed trials of antipsychotic monotherapy by history:  No  Recommended Plan for Multiple Antipsychotic Therapies:  N/A  Discharge Orders   Future Orders Complete By Expires     Activity as tolerated - No restrictions  As directed         Medication List    STOP taking these medications       famotidine 20 MG tablet  Commonly known as:  PEPCID      TAKE these medications     Indication   metoprolol 50 MG tablet  Commonly known as:  LOPRESSOR  Take 1 tablet (50 mg total) by mouth 2 (two) times daily.   Indication:  High Blood Pressure     omeprazole 10 MG capsule  Commonly known as:  PRILOSEC  Take 2 capsules (20 mg total) by mouth 2 (two) times daily.   Indication:   Gastroesophageal Reflux Disease with Current Symptoms     potassium chloride 10 MEQ tablet  Commonly known as:  K-DUR,KLOR-CON  Take 1 tablet (10 mEq total) by mouth 2 (two) times daily. To correct low amount of potassium in the blood. Take 1 tablet 05/12/12 at 5pm, then take 1 tablet 5/714 at 8 am and 5pm, then 1 tablet at 8 am on 05/14/12 then stop.   Indication:  Low Amount of Potassium in the Blood     sertraline 25 MG tablet  Commonly known as:  ZOLOFT  Take 1 tablet (25 mg total) by mouth daily.   Indication:  Major Depressive Disorder     sulfamethoxazole-trimethoprim 800-160 MG per tablet  Commonly known as:  BACTRIM DS  Take 1 tablet by mouth every 12 (twelve) hours. Take 1 tablet by mouth every 12 hours until finished.   Indication:  Infected gland located on left buttock     traZODone 50 MG tablet  Commonly known as:  DESYREL  Take 1 tablet (50 mg total) by mouth at bedtime as needed for sleep.   Indication:  Trouble Sleeping, Major Depressive Disorder         Follow-up recommendations:  Activity:  Resume usual activities Diet:  Regular  Comments:   Take all your medications as prescribed by your mental healthcare provider.  Report any adverse effects and or reactions from your medicines to your outpatient provider promptly.  Patient is instructed and cautioned to not engage in alcohol and or illegal drug use while on prescription medicines.  In the event of worsening symptoms, patient is instructed to call the crisis hotline, 911 and or go to the nearest ED for appropriate evaluation and treatment of symptoms.  Follow-up with your primary care provider for your other medical issues, concerns and or health care needs.     Total Discharge Time:  Greater than 30 minutes.  SignedFransisca Kaufmann NP-C 05/12/2012, 11:19 AM

## 2012-05-12 NOTE — BHH Group Notes (Signed)
[facility] LCSW Aftercare Discharge Planning Group Note   05/12/2012 8:45 AM  Participation Quality:  Appropriate  Mood/Affect:  Appropriate  Depression Rating:  1  Anxiety Rating:  2  Thoughts of Suicide: No Will you contract for safety?   NA  Current AVH: No  Plan for Discharge/Comments:  Admit to [facility] today at 2:30 PM  Transportation Means: Mother  Supports: Mother, husband, mother in law, sister, children  Dyane Dustman, Julious Payer

## 2012-05-12 NOTE — BHH Suicide Risk Assessment (Signed)
Suicide Risk Assessment  Discharge Assessment     Demographic Factors:  Caucasian, Female, married  Mental Status Per Nursing Assessment::   On Admission:     Current Mental Status by Physician: Patient alert and oriented to 4. Denies AH/VH/SI/HI.  Loss Factors: Decline in physical health  Historical Factors: Impulsivity  Risk Reduction Factors:   Sense of responsibility to family and Positive coping skills or problem solving skills  Continued Clinical Symptoms:  Alcohol/Substance Abuse/Dependencies  Cognitive Features That Contribute To Risk:  Cognitively intact  Suicide Risk:  Minimal: No identifiable suicidal ideation.  Patients presenting with no risk factors but with morbid ruminations; may be classified as minimal risk based on the severity of the depressive symptoms  Discharge Diagnoses:   AXIS I:  Alcohol Abuse and Substance Induced Mood Disorder AXIS II:  Deferred AXIS III:   Past Medical History  Diagnosis Date  . Hypertension   . Alcohol abuse    AXIS IV:  other psychosocial or environmental problems AXIS V:  61-70 mild symptoms  Plan Of Care/Follow-up recommendations:  Activity:  As tolerated Diet:  [facility]  Is patient on multiple antipsychotic therapies at discharge:  No   Has Patient had three or more failed trials of antipsychotic monotherapy by history:  No  Recommended Plan for Multiple Antipsychotic Therapies: NA  Rhonda Castro 05/12/2012, 10:42 AM

## 2012-05-13 LAB — WOUND CULTURE
Culture: NO GROWTH
Special Requests: NORMAL

## 2012-05-15 NOTE — Progress Notes (Signed)
Patient Discharge Instructions:  After Visit Summary (AVS):   Faxed to:  05/15/12 Discharge Summary Note:   Faxed to:  05/15/12 Psychiatric Admission Assessment Note:   Faxed to:  05/15/12 Suicide Risk Assessment - Discharge Assessment:   Faxed to:  05/15/12 Faxed/Sent to the Next Level Care provider:  05/15/12 Faxed to [facility] @ [phone] Jerelene Redden, 05/15/2012, 3:43 PM

## 2012-07-01 ENCOUNTER — Ambulatory Visit (INDEPENDENT_AMBULATORY_CARE_PROVIDER_SITE_OTHER): Payer: BC Managed Care – PPO | Admitting: Family Medicine

## 2012-07-01 VITALS — BP 125/80 | HR 56 | Temp 98.0°F | Resp 17 | Ht 67.0 in | Wt 160.0 lb

## 2012-07-01 DIAGNOSIS — F1010 Alcohol abuse, uncomplicated: Secondary | ICD-10-CM

## 2012-07-01 DIAGNOSIS — L0291 Cutaneous abscess, unspecified: Secondary | ICD-10-CM

## 2012-07-01 DIAGNOSIS — L0231 Cutaneous abscess of buttock: Secondary | ICD-10-CM

## 2012-07-01 DIAGNOSIS — L708 Other acne: Secondary | ICD-10-CM

## 2012-07-01 DIAGNOSIS — L0390 Cellulitis, unspecified: Secondary | ICD-10-CM

## 2012-07-01 MED ORDER — SERTRALINE HCL 25 MG PO TABS: 25.0000 mg | ORAL_TABLET | Freq: Every day | ORAL | Status: DC

## 2012-07-01 MED ORDER — SULFAMETHOXAZOLE-TRIMETHOPRIM 800-160 MG PO TABS: 1.0000 | ORAL_TABLET | Freq: Two times a day (BID) | ORAL | Status: DC

## 2012-07-01 MED ORDER — DOXYCYCLINE HYCLATE 100 MG PO TABS: 100.0000 mg | ORAL_TABLET | Freq: Every day | ORAL | Status: DC

## 2012-07-01 NOTE — Patient Instructions (Addendum)
Take the sulfa antibiotic one twice daily. Stopped the doxycycline. Her term if any concerns it's getting worse. Cultures pending  Continue your other medications as before.

## 2012-07-01 NOTE — Progress Notes (Signed)
Subjective: Patient is here for a refill on her Zoloft. She was at the alcohol rehabilitation, is going to AA, and doing well. She and her family are moving to [facility], but she is going to stay with her sister for a trial to make sure he alcoholism is stable before she moves  in with her husband and kids. She has a strong support brace and her sister.  She has a history of hidradenitis, and has multiple skin sores and abscesses. She has a place in her pubic hair and on her right buttock that she wants removed.  Objective: Multiple cystic acne places all over her back, under the breasts, buttocks, and in the suprapubic area. The left pubis area is draining a tiny bit. Also the place on the left buttock cheek is draining a little. I cultured it. Neither place feels like it's cystic enough to require excision. She has a place under of the medial aspect of the right breast ulcer is red and inflamed. She  is on doxycycline 100 mg twice a day for the last couple of weeks.  Assessment: Alcoholism Anxiety depression Cystic acne and folliculitis Abscesses on suprapubic and buttock areas  Plan: Try Bactrim DS twice a day for 2 weeks, then go back to doxycycline suppressive dose 100 mg one daily. Return if in all worse. Refilled her sertraline for her.

## 2012-07-04 LAB — WOUND CULTURE: Organism ID, Bacteria: NO GROWTH

## 2014-09-22 ENCOUNTER — Ambulatory Visit (INDEPENDENT_AMBULATORY_CARE_PROVIDER_SITE_OTHER): Payer: BLUE CROSS/BLUE SHIELD | Admitting: Family Medicine

## 2014-09-22 VITALS — BP 110/78 | HR 53 | Temp 98.0°F | Resp 18 | Ht 67.0 in | Wt 165.6 lb

## 2014-09-22 DIAGNOSIS — L0390 Cellulitis, unspecified: Secondary | ICD-10-CM

## 2014-09-22 DIAGNOSIS — F419 Anxiety disorder, unspecified: Secondary | ICD-10-CM | POA: Insufficient documentation

## 2014-09-22 DIAGNOSIS — L732 Hidradenitis suppurativa: Secondary | ICD-10-CM | POA: Diagnosis not present

## 2014-09-22 DIAGNOSIS — F32A Depression, unspecified: Secondary | ICD-10-CM | POA: Insufficient documentation

## 2014-09-22 DIAGNOSIS — K219 Gastro-esophageal reflux disease without esophagitis: Secondary | ICD-10-CM | POA: Insufficient documentation

## 2014-09-22 DIAGNOSIS — F329 Major depressive disorder, single episode, unspecified: Secondary | ICD-10-CM | POA: Diagnosis not present

## 2014-09-22 DIAGNOSIS — L0291 Cutaneous abscess, unspecified: Secondary | ICD-10-CM

## 2014-09-22 DIAGNOSIS — I10 Essential (primary) hypertension: Secondary | ICD-10-CM | POA: Insufficient documentation

## 2014-09-22 MED ORDER — OMEPRAZOLE 10 MG PO CPDR: 20.0000 mg | DELAYED_RELEASE_CAPSULE | Freq: Two times a day (BID) | ORAL | Status: DC

## 2014-09-22 MED ORDER — METOPROLOL TARTRATE 50 MG PO TABS: 50.0000 mg | ORAL_TABLET | Freq: Two times a day (BID) | ORAL | Status: DC

## 2014-09-22 MED ORDER — FLUOXETINE HCL 40 MG PO CAPS: 40.0000 mg | ORAL_CAPSULE | Freq: Every day | ORAL | Status: DC

## 2014-09-22 MED ORDER — DOXYCYCLINE HYCLATE 100 MG PO TABS: 100.0000 mg | ORAL_TABLET | Freq: Every day | ORAL | Status: DC

## 2014-09-22 NOTE — Progress Notes (Signed)
This chart was scribed for [name], MD by Stann Ore, medical scribe at [facility].The patient was seen in exam room 11 and the patient's care was started at 11:06 AM.  Patient ID: Rhonda Castro MRN: [id number], DOB: 08/10/1975, 39 y.o. Date of Encounter: 09/22/2014  Primary Physician: No PCP Per Patient  Chief Complaint:  Chief Complaint  Patient presents with  . Medication Refill    Prozac, doxycycline,omeprazole,metoprolol    HPI:  Rhonda Castro is a 39 y.o. female who presents to [facility] for medication refill.  She takes 100 mg doxycycline for her complexion. She has a history of hidradenitis. She was doing arthritis shots, but it didn't help the acne on her face. She tried accutane but no relief.   She takes lopressor for her BP. She is a smoker.  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  She left [place] a month ago.   Past Medical History  Diagnosis Date  . Hypertension   . Alcohol abuse   . Depression   . Anxiety   . Diabetes mellitus without complication      Home Meds: Prior to Admission medications   Medication Sig Start Date End Date Taking? Authorizing Provider  doxycycline (VIBRA-TABS) 100 MG tablet Take 1 tablet (100 mg total) by mouth daily. 07/01/12  Yes Peyton Najjar, MD  FLUoxetine (PROZAC) 40 MG capsule Take 40 mg by mouth daily.   Yes Historical Provider, MD  metoprolol (LOPRESSOR) 50 MG tablet Take 1 tablet (50 mg total) by mouth 2 (two) times daily. 05/12/12  Yes Thermon Leyland, NP  omeprazole (PRILOSEC) 10 MG capsule Take 2 capsules (20 mg total) by mouth 2 (two) times daily. 05/12/12  Yes Thermon Leyland, NP  sertraline (ZOLOFT) 25 MG tablet Take 1 tablet (25 mg total) by mouth daily. Patient not taking: Reported on 09/22/2014 07/01/12   Peyton Najjar, MD  sulfamethoxazole-trimethoprim (BACTRIM DS) 800-160 MG per tablet Take 1 tablet by mouth every 12 (twelve) hours. Take 1 tablet by mouth  every 12 hours until finished. Patient not taking: Reported on 09/22/2014 05/12/12   Thermon Leyland, NP  sulfamethoxazole-trimethoprim (BACTRIM DS,SEPTRA DS) 800-160 MG per tablet Take 1 tablet by mouth 2 (two) times daily. Patient not taking: Reported on 09/22/2014 07/01/12   Peyton Najjar, MD    Allergies:  Allergies  Allergen Reactions  . Aleve [Naproxen Sodium]   . Codeine   . Penicillins   . Vicodin [Hydrocodone-Acetaminophen]   . Vistaril [Hydroxyzine Hcl] Photosensitivity    Social History   Social History  . Marital Status: Married    Spouse Name: N/A  . Number of Children: N/A  . Years of Education: N/A   Occupational History  . Not on file.   Social History Main Topics  . Smoking status: Current Every Day Smoker -- 1.00 packs/day for 17 years    Types: Cigarettes  . Smokeless tobacco: Not on file  . Alcohol Use: Yes  . Drug Use: No  . Sexual Activity: No   Other Topics Concern  . Not on file   Social History Narrative     Review of Systems: Constitutional: negative for chills, fever, night sweats, weight changes, or fatigue  HEENT: negative for vision changes, hearing loss, congestion, rhinorrhea, ST, epistaxis, or sinus pressure Cardiovascular: negative for chest pain or palpitations Respiratory: negative for hemoptysis, wheezing, shortness of breath, or cough Abdominal: negative for abdominal pain, nausea, vomiting,  diarrhea, or constipation Dermatological: positive for facial acne Neurologic: negative for headache, dizziness, or syncope All other systems reviewed and are otherwise negative with the exception to those above and in the HPI.  Physical Exam:  Patient seen with her daughter who is out of school today because of orthodontia visit Blood pressure 110/78, pulse 53, temperature 98 F (36.7 C), temperature source Oral, resp. rate 18, height  (1.702 m), weight 165 lb 9.6 oz (75.116 kg), last menstrual period 09/12/2014, SpO2 99 %., Body mass  index is 25.93 kg/(m^2). General: Well developed, well nourished, in no acute distress. Head: Normocephalic, atraumatic, eyes without discharge, sclera non-icteric, nares are without discharge. Bilateral auditory canals clear, TM's are without perforation, pearly grey and translucent with reflective cone of light bilaterally. Oral cavity moist, posterior pharynx without exudate, erythema, peritonsillar abscess, or post nasal drip.  Neck: Supple. No thyromegaly. Full ROM. No lymphadenopathy. Lungs: Clear bilaterally to auscultation without wheezes, rales, or rhonchi. Breathing is unlabored. Heart: RRR with S1 S2. No murmurs, rubs, or gallops appreciated. Abdomen: Soft, non-tender, non-distended with normoactive bowel sounds. No hepatomegaly. No rebound/guarding. No obvious abdominal masses. Msk:  Strength and tone normal for age. Extremities/Skin: Warm and dry. No clubbing or cyanosis. No edema. No rashes or suspicious lesions. Neuro: Alert and oriented X 3. Moves all extremities spontaneously. Gait is normal. CNII-XII grossly in tact. Psych:  Responds to questions appropriately with a normal affect.  Smoking cessation counseling provided. We also went over her ASCVD risk.  ASSESSMENT AND PLAN:  39 y.o. year old female with the problems below.  I do not have the cholesterol report, but I do know that quitting smoking will reduce her risk of heart disease much more than taking cholesterol-lowering medications and she should focus on this This chart was scribed in my presence and reviewed by me personally.    ICD-9-CM ICD-10-CM   1. Abscess and cellulitis 682.9 L03.90 doxycycline (VIBRA-TABS) 100 MG tablet    L02.91   2. Depression 311 F32.9 FLUoxetine (PROZAC) 40 MG capsule  3. Essential hypertension 401.9 I10 metoprolol (LOPRESSOR) 50 MG tablet  4. Gastroesophageal reflux disease without esophagitis 530.81 K21.9 omeprazole (PRILOSEC) 10 MG capsule     Signed, [name],  MD    Signed, [name], MD 09/22/2014 11:06 AM

## 2014-09-22 NOTE — Patient Instructions (Signed)
The ASCVD risk estimator puts your lifetime risk of heart disease or stroke at 50% and your 10 year risk is 8%. Treating elevated cholesterol will bring those in her down but not as much as quitting smoking. This is your life, you need to make healthy decisions.

## 2015-01-29 ENCOUNTER — Ambulatory Visit (INDEPENDENT_AMBULATORY_CARE_PROVIDER_SITE_OTHER): Payer: BLUE CROSS/BLUE SHIELD | Admitting: Physician Assistant

## 2015-01-29 DIAGNOSIS — R12 Heartburn: Secondary | ICD-10-CM | POA: Diagnosis not present

## 2015-01-29 DIAGNOSIS — I10 Essential (primary) hypertension: Secondary | ICD-10-CM | POA: Diagnosis not present

## 2015-01-29 DIAGNOSIS — F329 Major depressive disorder, single episode, unspecified: Secondary | ICD-10-CM

## 2015-01-29 DIAGNOSIS — F418 Other specified anxiety disorders: Secondary | ICD-10-CM

## 2015-01-29 DIAGNOSIS — F1011 Alcohol abuse, in remission: Secondary | ICD-10-CM

## 2015-01-29 DIAGNOSIS — L732 Hidradenitis suppurativa: Secondary | ICD-10-CM | POA: Diagnosis not present

## 2015-01-29 DIAGNOSIS — F419 Anxiety disorder, unspecified: Secondary | ICD-10-CM

## 2015-01-29 DIAGNOSIS — F1010 Alcohol abuse, uncomplicated: Secondary | ICD-10-CM | POA: Diagnosis not present

## 2015-01-29 MED ORDER — FLUOXETINE HCL 20 MG PO TABS: 60.0000 mg | ORAL_TABLET | Freq: Every day | ORAL | Status: DC

## 2015-01-29 MED ORDER — OMEPRAZOLE 40 MG PO CPDR: 40.0000 mg | DELAYED_RELEASE_CAPSULE | Freq: Every day | ORAL | Status: DC

## 2015-01-29 MED ORDER — FLUOXETINE HCL 20 MG PO CAPS: ORAL_CAPSULE | ORAL | Status: DC

## 2015-01-29 MED ORDER — DOXYCYCLINE HYCLATE 100 MG PO TABS: 100.0000 mg | ORAL_TABLET | Freq: Every day | ORAL | Status: DC

## 2015-01-29 MED ORDER — METOPROLOL SUCCINATE ER 100 MG PO TB24: 100.0000 mg | ORAL_TABLET | Freq: Every day | ORAL | Status: DC

## 2015-01-29 NOTE — Addendum Note (Signed)
Addended by: Eddie Candle on: 01/29/2015 03:03 PM   Modules accepted: Orders, Medications

## 2015-01-29 NOTE — Progress Notes (Signed)
Rhonda Castro  MRN: [id number] DOB: 1975/06/23  Subjective:  Pt presents to clinic for medication refills.  She has long standing anxiety and depression with use of prozac that she ran out of 3 days ago and now she feels terrible.  She is currently on a  dose but in the past has been on  for good control.  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  She is planning on going to a AA meeting tonight, as they have been helpful in the past.  She does not believe that her anxiety has been controlled on the Prozac  and wonders if she needs a different medication.  Her sister is with her today and states that the patient seems to have no energy or motivation esp when it comes to her children.  The patient recently moved back to [place] from [facility] and has not yet set up medical care.  She has no current SI or HI and even in withdraw from Prozac see feels pretty stable.  She works at [employer] in a physical job and is tired when she gets home - she feels like she sleeps ok.  Patient Active Problem List   Diagnosis Date Noted  . Gastroesophageal reflux disease without esophagitis - on Prilosec and it helps - very expensive OTC 09/22/2014  . Essential hypertension - hard time taking her bid dosing of medication - feels like it helps - also does not have a lot of palpitations on her Metoprolol as it helps with her anxiety also 09/22/2014  . Depression - not optimal control on Prozac  09/22/2014  . Hidradenitis - takes daily doxy - she has been on humara in the past which helped but then her adult acne flaired and the dermatologist had her choose between Doxy and Humara 09/22/2014  . Alcohol abuse - clean for 2 y 77 m - proud of  that and wants to stay that way - still has temptations but has not relapsed     No current outpatient prescriptions on file prior to visit.   No current facility-administered medications on file prior to visit.    Allergies  Allergen Reactions  . Aleve [Naproxen Sodium]   . Codeine   . Penicillins   . Vicodin [Hydrocodone-Acetaminophen]   . Vistaril [Hydroxyzine Hcl] Photosensitivity    Review of Systems  Skin: Positive for wound.  Psychiatric/Behavioral: Positive for dysphoric mood. Negative for suicidal ideas and sleep disturbance. The patient is nervous/anxious.    Objective:  BP 118/78 mmHg  Pulse 65  Temp(Src) 97.8 F (36.6 C) (Oral)  Resp 18  Ht  (1.702 m)  Wt 158 lb 6.4 oz (71.85 kg)  BMI 24.80 kg/m2  SpO2 99%  LMP 01/26/2015  Physical Exam  Constitutional: She is oriented to person, place, and time and well-developed, well-nourished, and in no distress.  HENT:  Head: Normocephalic and atraumatic.  Right Ear: Hearing and external ear normal.  Left Ear: Hearing and external ear normal.  Eyes: Conjunctivae are normal.  Neck: Normal range of motion.  Pulmonary/Chest: Effort normal.  Neurological: She is alert and oriented to person, place, and time. Gait normal.  Skin: Skin is warm and dry.  Acne scars on face from cystic acne, hydradenitis scars and abscess over trunk   Psychiatric: Mood, memory, affect and judgment normal.  shaky and biting nails during visit  Vitals reviewed.   Assessment and Plan :  Abscess and cellulitis - Plan: doxycycline (VIBRA-TABS) 100 MG tablet  Essential hypertension - Plan: metoprolol succinate (TOPROL-XL) 100 MG 24 hr tablet  Heartburn - Plan: omeprazole (PRILOSEC) 40 MG capsule  Anxiety and depression - Plan: FLUoxetine (PROZAC) 20 MG tablet   Encouraged pt to set up care and get a CPE.  We will increase her Prozac today and I expect this will need to be further increased to  but I am hesitant to go from 40-80mg   at one time.  She plans to go to an AA meeting tonight and that I think that is a great idea.  We talked about therapy in helping deal with her current stressful situation with her ex-husband and kids and her anxiety and depression - she has had in the past and thinks that she might be interested again.  Encouraged her to start some type of exercise program.  Changed her medication to once daily dosing of metoprolol to help with compliance, continued her other medications.  She will recheck with me in 4-6 weeks for an increased in her Prozac and hopefully a CPE.  Benny Lennert PA-C  [facility] 01/29/2015 11:01 AM

## 2015-01-29 NOTE — Patient Instructions (Signed)
For therapy -- [facility] (Beth Kincaid, Ernest McCoy, Heather Kitchens, Karla Townsend) - [phone] [facility] (Julie Whitt) - [phone] [facility] - [phone] [facility] - [phone] [facility]  - [phone] (do not file insurance)    

## 2015-02-05 ENCOUNTER — Ambulatory Visit (INDEPENDENT_AMBULATORY_CARE_PROVIDER_SITE_OTHER): Payer: BLUE CROSS/BLUE SHIELD | Admitting: Internal Medicine

## 2015-02-05 VITALS — BP 120/78 | HR 76 | Temp 98.3°F | Resp 18 | Ht 67.32 in | Wt 160.6 lb

## 2015-02-05 DIAGNOSIS — J4520 Mild intermittent asthma, uncomplicated: Secondary | ICD-10-CM

## 2015-02-05 DIAGNOSIS — J0100 Acute maxillary sinusitis, unspecified: Secondary | ICD-10-CM

## 2015-02-05 MED ORDER — PREDNISONE 20 MG PO TABS: ORAL_TABLET | ORAL | Status: DC

## 2015-02-05 MED ORDER — CEFDINIR 300 MG PO CAPS: 300.0000 mg | ORAL_CAPSULE | Freq: Two times a day (BID) | ORAL | Status: DC

## 2015-02-05 NOTE — Progress Notes (Signed)
Subjective:    Patient ID: Rhonda Castro, female    DOB: 1975/08/11, 40 y.o.   MRN: [id number] By signing my name below, I, Javier Docker, attest that this documentation has been prepared under the direction and in the presence of Ellamae Sia, MD. Electronically Signed: Javier Docker, ER Scribe. 02/05/2015. 8:25 AM.  Chief Complaint  Patient presents with  . Shortness of Breath    started monday   . Chest Pain  . Ear Pain    HPI HPI Comments: Rhonda Castro is a 40 y.o. female who presents to [facility] complaining of chest tightness and SOB with associated fatigue that started on Monday. She has an intermittent mild cough. She denies sore throat, fever chills. She has no past hx of asthma. She smokes. When she exhales deeply she feels a point pain in her chest. She takes daily doxycycline. She did not have a flu shot this year. She has two children and they have been intermittently sick with cold. If she bends over she feels fullness in her head. She has a buzzing sensation in her ears, but she is not sure if that is due to the fact that she ran out of prozac last week.    Past Medical History  Diagnosis Date  . Hypertension   . Alcohol abuse   . Depression   . Anxiety   . Diabetes mellitus without complication (HCC)    Allergies  Allergen Reactions  . Aleve [Naproxen Sodium]   . Codeine   . Penicillins   . Vicodin [Hydrocodone-Acetaminophen]   . Vistaril [Hydroxyzine Hcl] Photosensitivity   Current Outpatient Prescriptions on File Prior to Visit  Medication Sig Dispense Refill  . doxycycline (VIBRA-TABS) 100 MG tablet Take 1 tablet (100 mg total) by mouth daily. 30 tablet 5  . FLUoxetine (PROZAC) 20 MG capsule Take 3 capsules by mouth daily 90 capsule 1  . metoprolol succinate (TOPROL-XL) 100 MG 24 hr tablet Take 1 tablet (100 mg total) by mouth daily. Take with or immediately following a meal. 30 tablet 1  . omeprazole (PRILOSEC) 40 MG capsule Take 1 capsule (40  mg total) by mouth daily. 30 capsule 12   No current facility-administered medications on file prior to visit.    Review of Systems  Constitutional: Negative for fever, chills and diaphoresis.  HENT: Positive for ear pain and sinus pressure. Negative for sore throat.   Respiratory: Positive for cough, chest tightness and shortness of breath.   Cardiovascular: Positive for chest pain.       Objective:  BP 120/78 mmHg  Pulse 76  Temp(Src) 98.3 F (36.8 C) (Oral)  Resp 18  Ht 5' 7.32" (1.71 m)  Wt 160 lb 9.6 oz (72.848 kg)  BMI 24.91 kg/m2  SpO2 99%  LMP 01/26/2015  Physical Exam  Constitutional: She is oriented to person, place, and time. She appears well-developed and well-nourished. No distress.  HENT:  Head: Normocephalic and atraumatic.  TMs are dull. Purulent nasal discharge on left. Throat clear.  Eyes: Pupils are equal, round, and reactive to light.  Neck: Neck supple.  No cervical nodes.   Cardiovascular: Normal rate.   Pulmonary/Chest: Effort normal. No respiratory distress.  Chest with scattered rhonchi.   Musculoskeletal: Normal range of motion.  Neurological: She is alert and oriented to person, place, and time. Coordination normal.  Skin: Skin is warm and dry. She is not diaphoretic.  Psychiatric: She has a normal mood and affect. Her behavior is normal.  Nursing  note and vitals reviewed.     Assessment & Plan:  IAcute maxillary sinusitis, recurrence not specified  RAD (reactive airway disease), mild intermittent, uncomplicated    Meds ordered this encounter  Medications  . cefdinir (OMNICEF) 300 MG capsule    Sig: Take 1 capsule (300 mg total) by mouth 2 (two) times daily.    Dispense:  20 capsule    Refill:  0  . predniSONE (DELTASONE) 20 MG tablet    Sig: 2 tabs a day for 3 days    Dispense:  6 tablet    Refill:  0    I have completed the patient encounter in its entirety as documented by the scribe, with editing by me where necessary. Annalynn Centanni  P. Merla Riches, M.D.

## 2015-02-15 ENCOUNTER — Ambulatory Visit (INDEPENDENT_AMBULATORY_CARE_PROVIDER_SITE_OTHER): Payer: BLUE CROSS/BLUE SHIELD | Admitting: Family Medicine

## 2015-02-15 VITALS — BP 112/80 | HR 48 | Temp 98.3°F | Resp 16 | Ht 66.5 in | Wt 156.0 lb

## 2015-02-15 DIAGNOSIS — F329 Major depressive disorder, single episode, unspecified: Secondary | ICD-10-CM

## 2015-02-15 DIAGNOSIS — J9809 Other diseases of bronchus, not elsewhere classified: Secondary | ICD-10-CM

## 2015-02-15 DIAGNOSIS — Z720 Tobacco use: Secondary | ICD-10-CM

## 2015-02-15 DIAGNOSIS — H6121 Impacted cerumen, right ear: Secondary | ICD-10-CM | POA: Diagnosis not present

## 2015-02-15 DIAGNOSIS — R42 Dizziness and giddiness: Secondary | ICD-10-CM

## 2015-02-15 DIAGNOSIS — J00 Acute nasopharyngitis [common cold]: Secondary | ICD-10-CM

## 2015-02-15 DIAGNOSIS — F418 Other specified anxiety disorders: Secondary | ICD-10-CM

## 2015-02-15 DIAGNOSIS — R001 Bradycardia, unspecified: Secondary | ICD-10-CM

## 2015-02-15 DIAGNOSIS — R5383 Other fatigue: Secondary | ICD-10-CM

## 2015-02-15 DIAGNOSIS — F32A Depression, unspecified: Secondary | ICD-10-CM

## 2015-02-15 DIAGNOSIS — F419 Anxiety disorder, unspecified: Secondary | ICD-10-CM

## 2015-02-15 LAB — COMPREHENSIVE METABOLIC PANEL
ALBUMIN: 3.6 g/dL (ref 3.6–5.1)
ALT: 12 U/L (ref 6–29)
AST: 18 U/L (ref 10–30)
Alkaline Phosphatase: 56 U/L (ref 33–115)
BUN: 12 mg/dL (ref 7–25)
CALCIUM: 8.9 mg/dL (ref 8.6–10.2)
CHLORIDE: 108 mmol/L (ref 98–110)
CO2: 21 mmol/L (ref 20–31)
CREATININE: 0.91 mg/dL (ref 0.50–1.10)
Glucose, Bld: 64 mg/dL — ABNORMAL LOW (ref 65–99)
POTASSIUM: 4.8 mmol/L (ref 3.5–5.3)
Sodium: 140 mmol/L (ref 135–146)
TOTAL PROTEIN: 6.4 g/dL (ref 6.1–8.1)
Total Bilirubin: 0.2 mg/dL (ref 0.2–1.2)

## 2015-02-15 LAB — POCT CBC
GRANULOCYTE PERCENT: 68.2 % (ref 37–80)
HEMATOCRIT: 41.6 % (ref 37.7–47.9)
Hemoglobin: 14 g/dL (ref 12.2–16.2)
LYMPH, POC: 2 (ref 0.6–3.4)
MCH, POC: 33.2 pg — AB (ref 27–31.2)
MCHC: 33.6 g/dL (ref 31.8–35.4)
MCV: 98.6 fL — AB (ref 80–97)
MID (CBC): 0.2 (ref 0–0.9)
MPV: 8.3 fL (ref 0–99.8)
POC GRANULOCYTE: 4.6 (ref 2–6.9)
POC LYMPH %: 29.4 % (ref 10–50)
POC MID %: 2.4 % (ref 0–12)
Platelet Count, POC: 212 10*3/uL (ref 142–424)
RBC: 4.22 M/uL (ref 4.04–5.48)
RDW, POC: 17.6 %
WBC: 6.7 10*3/uL (ref 4.6–10.2)

## 2015-02-15 LAB — TSH: TSH: 1.1 m[IU]/L

## 2015-02-15 LAB — POCT SEDIMENTATION RATE: POCT SED RATE: 40 mm/hr — AB (ref 0–22)

## 2015-02-15 MED ORDER — IPRATROPIUM BROMIDE 0.02 % IN SOLN
0.5000 mg | Freq: Once | RESPIRATORY_TRACT | Status: AC
  Administered 2015-02-15: 0.5 mg via RESPIRATORY_TRACT

## 2015-02-15 MED ORDER — FLUTICASONE PROPIONATE 50 MCG/ACT NA SUSP: 2.0000 | Freq: Every day | NASAL | Status: DC

## 2015-02-15 MED ORDER — ALBUTEROL SULFATE (2.5 MG/3ML) 0.083% IN NEBU
2.5000 mg | INHALATION_SOLUTION | Freq: Once | RESPIRATORY_TRACT | Status: AC
  Administered 2015-02-15: 2.5 mg via RESPIRATORY_TRACT

## 2015-02-15 MED ORDER — PROPRANOLOL HCL 10 MG PO TABS: 10.0000 mg | ORAL_TABLET | Freq: Four times a day (QID) | ORAL | Status: DC | PRN

## 2015-02-15 NOTE — Patient Instructions (Addendum)
Take a tab of propranolol if you have severe anxiety and palpitations or top BP # is >160 or bottom # >95.  Check BP and HR once to twice a day and record - vary the times you check them. If you are having severe anxiety, try a benadryl Recheck Sat or Sun with Benny Lennert (8-4) or myself next week on Monday, Tuesday, or Wednesday (8-6). Continue your prozac 60.   Bradycardia Bradycardia is a slower-than-normal heart rate. A normal resting heart rate for an adult ranges from 60 to 100 beats per minute. With bradycardia, the resting heart rate is less than 60 beats per minute. Bradycardia is a problem if your heart cannot pump enough oxygen-rich blood through your body. Bradycardia is not a problem for everyone. For some healthy adults, a slow resting heart rate is normal.  CAUSES  Bradycardia may be caused by:  A problem with the heart's electrical system, such as heart block.  A problem with the heart's natural pacemaker (sinus node).  Heart disease, damage, or infection.  Certain medicines that treat heart conditions.  Certain conditions, such as hypothyroidism and obstructive sleep apnea. RISK FACTORS  Risk factors include:  Being 48 or older.  Having high blood pressure (hypertension), high cholesterol (hyperlipidemia), or diabetes.  Drinking heavily, using tobacco products, or using drugs.  Being stressed. SIGNS AND SYMPTOMS  Signs and symptoms include:  Light-headedness.  Faintingor near fainting.  Fatigue and weakness.  Shortness of breath.  Chest pain (angina).  Drowsiness.  Confusion.  Dizziness. DIAGNOSIS  Diagnosis of bradycardia may include:  A physical exam.  An electrocardiogram (ECG).  Blood tests. TREATMENT  Treatment for bradycardia may include:  Treatment of an underlying condition.  Pacemaker placement. A pacemaker is a small, battery-powered device that is placed under the skin and is programmed to sense your heartbeats. If your heart  rate is lower than the programmed rate, the pacemaker will pace your heart.  Changing your medicines or dosages. HOME CARE INSTRUCTIONS  Take medicines only as directed by your health care provider.  Manage any health conditions that contribute to bradycardia as directed by your health care provider.  Follow a heart-healthy diet. A dietitian can help educate you on healthy food options and changes.  Follow an exercise program approved by your health care provider.  Maintain a healthy weight. Lose weight as approved by your health care provider.  Do not use tobacco products, including cigarettes, chewing tobacco, or electronic cigarettes. If you need help quitting, ask your health care provider.  Do not use illegal drugs.  Limit alcohol intake to no more than 1 drink per day for nonpregnant women and 2 drinks per day for men. One drink equals 12 ounces of beer, 5 ounces of wine, or 1 ounces of hard liquor.  Keep all follow-up visits as directed by your health care provider. This is important. SEEK MEDICAL CARE IF:  You feel light-headed or dizzy.  You almost faint.  You feel weak or are easily fatigued during physical activity.  You experience confusion or have memory problems. SEEK IMMEDIATE MEDICAL CARE IF:   You faint.  You have an irregular heartbeat.  You have chest pain.  You have trouble breathing. MAKE SURE YOU:   Understand these instructions.  Will watch your condition.  Will get help right away if you are not doing well or get worse.   This information is not intended to replace advice given to you by your health care provider. Make sure  you discuss any questions you have with your health care provider.   Document Released: 09/15/2001 Document Revised: 01/14/2014 Document Reviewed: 03/31/2013 Elsevier Interactive Patient Education [employer]

## 2015-02-15 NOTE — Progress Notes (Signed)
Subjective:  By signing my name below, I, Rhonda Castro, attest that this documentation has been prepared under the direction and in the presence of Rhonda Sorenson, MD. Electronically Signed: Stann Castro, Scribe. 02/15/2015 , 10:20 AM .  Patient was seen in Room 8 .   Patient ID: Rhonda Castro, female    DOB: 06-07-1975, 40 y.o.   MRN: [id number] Chief Complaint  Patient presents with  . Follow-up    per patient sinus infection  . Allergic Reaction    per patient omnicef - face redness  . Depression    answers positive in triage   HPI Rhonda Castro is a 40 y.o. female who presents to [facility] for follow up on sinusitis and reactive airway disease. She was initially seen by Dr. Merla Riches about 10 days ago (02/05/2015). She was given omnicef  bid and prednisone  bid 3x days.   She states that her face started feeling really warm, swelling and tender and believes it's due to omnicef. She stopped taking this antibiotic on day 8. She notes feeling doxycycline daily. Her nose feels congested and clogged up. She still has chest tightness and shortness of breath with exertion.   She complains of tinnitus, feeling irritated and decreased concentration. She also feels really fatigue around lunch for years. She notes speech slurring mentioned by her family about 3 weeks ago. She would occasionally notice sudden darkness for few milliseconds (like a blink) without her knowing and over-brightness. She informs having double stigmatism.   She started taking toprol for HTN about 3 weeks ago. She was on lopressor  bid prior without any changes to her BP. This was changed when prozac was increased 2-3 weeks prior.  In 2014, her BP was 140-160 / 90-110; however, she was actively abusing alcohol during this period.  She's also taken prozac for a few years now.   She denies having a PCP.   Past Medical History  Diagnosis Date  . Hypertension   . Alcohol abuse   . Depression   . Anxiety   .  Diabetes mellitus without complication (HCC)    Prior to Admission medications   Medication Sig Start Date End Date Taking? Authorizing Provider  doxycycline (VIBRA-TABS) 100 MG tablet Take 1 tablet (100 mg total) by mouth daily. 01/29/15  Yes Morrell Riddle, PA-C  FLUoxetine (PROZAC) 20 MG capsule Take 3 capsules by mouth daily 01/29/15  Yes Morrell Riddle, PA-C  metoprolol succinate (TOPROL-XL) 100 MG 24 hr tablet Take 1 tablet (100 mg total) by mouth daily. Take with or immediately following a meal. 01/29/15  Yes Morrell Riddle, PA-C  omeprazole (PRILOSEC) 40 MG capsule Take 1 capsule (40 mg total) by mouth daily. 01/29/15  Yes Morrell Riddle, PA-C  cefdinir (OMNICEF) 300 MG capsule Take 1 capsule (300 mg total) by mouth 2 (two) times daily. Patient not taking: Reported on 02/15/2015 02/05/15   Tonye Pearson, MD  predniSONE (DELTASONE) 20 MG tablet 2 tabs a day for 3 days Patient not taking: Reported on 02/15/2015 02/05/15   Tonye Pearson, MD   Allergies  Allergen Reactions  . Aleve [Naproxen Sodium]   . Codeine   . Penicillins   . Vicodin [Hydrocodone-Acetaminophen]   . Vistaril [Hydroxyzine Hcl] Photosensitivity    Review of Systems  Constitutional: Positive for fatigue. Negative for fever, chills and diaphoresis.  HENT: Positive for tinnitus.   Eyes: Positive for visual disturbance.  Respiratory: Positive for cough, chest tightness and shortness of breath.  Negative for wheezing.   Gastrointestinal: Negative for nausea and vomiting.  Neurological: Positive for light-headedness.  Psychiatric/Behavioral: Positive for decreased concentration.       Objective:   Physical Exam  Constitutional: She is oriented to person, place, and time. She appears well-developed and well-nourished. No distress.  HENT:  Head: Normocephalic and atraumatic.  Left Ear: Tympanic membrane normal.  Nose: Nose normal.  Mouth/Throat: Oropharynx is clear and moist.  Right cerumen  Eyes: EOM are normal.  Pupils are equal, round, and reactive to light.  Neck: Neck supple.  Cardiovascular: Normal rate.   Pulmonary/Chest: Effort normal. No respiratory distress.  Musculoskeletal: Normal range of motion.  Neurological: She is alert and oriented to person, place, and time.  Skin: Skin is warm and dry.  Psychiatric: She has a normal mood and affect. Her behavior is normal.  Nursing note and vitals reviewed.  Predicted peak flow: 488 negative ortho EKG: showed marked sinus brady cardia, QTc 405  BP 112/80 mmHg  Pulse 48  Temp(Src) 98.3 F (36.8 C) (Oral)  Resp 16  Ht 5' 6.5" (1.689 m)  Wt 156 lb (70.761 kg)  BMI 24.80 kg/m2  SpO2 98%  LMP 01/26/2015   Results for orders placed or performed in visit on 02/15/15  POCT CBC  Result Value Ref Range   WBC 6.7 4.6 - 10.2 K/uL   Lymph, poc 2.0 0.6 - 3.4   POC LYMPH PERCENT 29.4 10 - 50 %L   MID (cbc) 0.2 0 - 0.9   POC MID % 2.4 0 - 12 %M   POC Granulocyte 4.6 2 - 6.9   Granulocyte percent 68.2 37 - 80 %G   RBC 4.22 4.04 - 5.48 M/uL   Hemoglobin 14.0 12.2 - 16.2 g/dL   HCT, POC 16.1 09.6 - 47.9 %   MCV 98.6 (A) 80 - 97 fL   MCH, POC 33.2 (A) 27 - 31.2 pg   MCHC 33.6 31.8 - 35.4 g/dL   RDW, POC 04.5 %   Platelet Count, POC 212 142 - 424 K/uL   MPV 8.3 0 - 99.8 fL      Assessment & Plan:   1. Bradycardia - suspect pt's sxs are due to combo of recent increase in both toprol and prozac.  Stop toprol - pt has metoprolol tartrate at home to resume if needed but rec staying off and monitoring pulse.  If pulse and BP spikes, try prn propranolol as it may also have positive effect on anxiety. See pt instructions below for this.  If sxs persist off of BB may then need to due trial of decreasing prozac back from recent increase up to 60mg   2. Other fatigue   3. Dizziness and giddiness   4. Acute rhinitis   5. Anxiety and depression   6. Right ear impacted cerumen - removed by curete by myself.  7. Tobacco abuse   8. Bronchospastic airway  disease    Take a tab of propranolol if you have severe anxiety and palpitations or top BP # is >160 or bottom # >95.  Check BP and HR once to twice a day and record - vary the times you check them. If you are having severe anxiety, try a benadryl   Recheck in 1 wk.  Orders Placed This Encounter  Procedures  . Comprehensive metabolic panel  . TSH  . Orthostatic vital signs  . Ear wax removal  . POCT SEDIMENTATION RATE  . POCT CBC  . EKG 12-Lead  Meds ordered this encounter  Medications  . propranolol (INDERAL) 10 MG tablet    Sig: Take 1 tablet (10 mg total) by mouth 4 (four) times daily as needed (palpitations, hypertension, anxiety).    Dispense:  40 tablet    Refill:  0  . fluticasone (FLONASE) 50 MCG/ACT nasal spray    Sig: Place 2 sprays into both nostrils at bedtime.    Dispense:  16 g    Refill:  2  . albuterol (PROVENTIL) (2.5 MG/3ML) 0.083% nebulizer solution 2.5 mg    Sig:   . ipratropium (ATROVENT) nebulizer solution 0.5 mg    Sig:     I personally performed the services described in this documentation, which was scribed in my presence. The recorded information has been reviewed and considered, and addended by me as needed.  Rhonda Sorenson, MD MPH

## 2015-02-23 ENCOUNTER — Ambulatory Visit (INDEPENDENT_AMBULATORY_CARE_PROVIDER_SITE_OTHER): Payer: BLUE CROSS/BLUE SHIELD | Admitting: Family Medicine

## 2015-02-23 VITALS — BP 151/81 | HR 61 | Temp 98.6°F | Resp 16 | Ht 66.0 in | Wt 159.0 lb

## 2015-02-23 DIAGNOSIS — L259 Unspecified contact dermatitis, unspecified cause: Secondary | ICD-10-CM | POA: Diagnosis not present

## 2015-02-23 DIAGNOSIS — B373 Candidiasis of vulva and vagina: Secondary | ICD-10-CM

## 2015-02-23 DIAGNOSIS — F419 Anxiety disorder, unspecified: Principal | ICD-10-CM

## 2015-02-23 DIAGNOSIS — F418 Other specified anxiety disorders: Secondary | ICD-10-CM | POA: Diagnosis not present

## 2015-02-23 DIAGNOSIS — G4710 Hypersomnia, unspecified: Secondary | ICD-10-CM | POA: Diagnosis not present

## 2015-02-23 DIAGNOSIS — J301 Allergic rhinitis due to pollen: Secondary | ICD-10-CM | POA: Diagnosis not present

## 2015-02-23 DIAGNOSIS — F329 Major depressive disorder, single episode, unspecified: Secondary | ICD-10-CM

## 2015-02-23 DIAGNOSIS — I10 Essential (primary) hypertension: Secondary | ICD-10-CM | POA: Diagnosis not present

## 2015-02-23 DIAGNOSIS — F1021 Alcohol dependence, in remission: Secondary | ICD-10-CM

## 2015-02-23 DIAGNOSIS — B3731 Acute candidiasis of vulva and vagina: Secondary | ICD-10-CM

## 2015-02-23 MED ORDER — FLUCONAZOLE 150 MG PO TABS: 150.0000 mg | ORAL_TABLET | Freq: Once | ORAL | Status: DC

## 2015-02-23 MED ORDER — HYDROCHLOROTHIAZIDE 12.5 MG PO TABS: 12.5000 mg | ORAL_TABLET | Freq: Every day | ORAL | Status: AC
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
## 2015-03-13 ENCOUNTER — Ambulatory Visit: Payer: BLUE CROSS/BLUE SHIELD

## 2015-03-31 ENCOUNTER — Other Ambulatory Visit: Payer: Self-pay | Admitting: Physician Assistant

## 2015-04-02 NOTE — Telephone Encounter (Signed)
Spoke with patient she is ok with the 60 mg prozac.  Will be coming in within this month for a recheck .

## 2015-04-22 ENCOUNTER — Other Ambulatory Visit: Payer: Self-pay | Admitting: Family Medicine

## 2015-04-23 ENCOUNTER — Other Ambulatory Visit: Payer: Self-pay | Admitting: Physician Assistant

## 2015-05-31 ENCOUNTER — Other Ambulatory Visit: Payer: Self-pay | Admitting: Family Medicine

## 2015-06-02 NOTE — Telephone Encounter (Signed)
Left message, what is patients plan for follow up? Needs to RTC for refills

## 2015-11-06 ENCOUNTER — Ambulatory Visit: Payer: BLUE CROSS/BLUE SHIELD

## 2016-07-15 ENCOUNTER — Other Ambulatory Visit: Payer: Self-pay | Admitting: Family Medicine

## 2016-07-15 ENCOUNTER — Ambulatory Visit
Admission: RE | Admit: 2016-07-15 | Discharge: 2016-07-15 | Disposition: A | Discharge status: Home / Self Care | Payer: BLUE CROSS/BLUE SHIELD | Source: Ambulatory Visit | Attending: Family Medicine | Admitting: Family Medicine

## 2016-07-15 DIAGNOSIS — R109 Unspecified abdominal pain: Secondary | ICD-10-CM

## 2016-07-31 ENCOUNTER — Emergency Department (HOSPITAL_COMMUNITY)
Admission: EM | Admit: 2016-07-31 | Discharge: 2016-07-31 | Disposition: A | Discharge status: Home / Self Care | Payer: BLUE CROSS/BLUE SHIELD | Attending: Emergency Medicine | Admitting: Emergency Medicine

## 2016-07-31 ENCOUNTER — Encounter (HOSPITAL_COMMUNITY): Payer: Self-pay

## 2016-07-31 DIAGNOSIS — R310 Gross hematuria: Secondary | ICD-10-CM | POA: Diagnosis not present

## 2016-07-31 DIAGNOSIS — F17210 Nicotine dependence, cigarettes, uncomplicated: Secondary | ICD-10-CM | POA: Diagnosis not present

## 2016-07-31 DIAGNOSIS — Z79899 Other long term (current) drug therapy: Secondary | ICD-10-CM | POA: Insufficient documentation

## 2016-07-31 DIAGNOSIS — R339 Retention of urine, unspecified: Secondary | ICD-10-CM | POA: Diagnosis present

## 2016-07-31 DIAGNOSIS — I10 Essential (primary) hypertension: Secondary | ICD-10-CM | POA: Diagnosis not present

## 2016-07-31 DIAGNOSIS — R301 Vesical tenesmus: Secondary | ICD-10-CM | POA: Diagnosis not present

## 2016-07-31 DIAGNOSIS — E119 Type 2 diabetes mellitus without complications: Secondary | ICD-10-CM | POA: Diagnosis not present

## 2016-07-31 LAB — CBC WITH DIFFERENTIAL/PLATELET
BASOS PCT: 0 %
Basophils Absolute: 0 10*3/uL (ref 0.0–0.1)
EOS ABS: 0 10*3/uL (ref 0.0–0.7)
Eosinophils Relative: 0 %
HCT: 43.7 % (ref 36.0–46.0)
Hemoglobin: 15.7 g/dL — ABNORMAL HIGH (ref 12.0–15.0)
Lymphocytes Relative: 7 %
Lymphs Abs: 1.2 10*3/uL (ref 0.7–4.0)
MCH: 33.1 pg (ref 26.0–34.0)
MCHC: 35.9 g/dL (ref 30.0–36.0)
MCV: 92 fL (ref 78.0–100.0)
MONO ABS: 0.9 10*3/uL (ref 0.1–1.0)
MONOS PCT: 5 %
Neutro Abs: 14.3 10*3/uL — ABNORMAL HIGH (ref 1.7–7.7)
Neutrophils Relative %: 88 %
Platelets: 214 10*3/uL (ref 150–400)
RBC: 4.75 MIL/uL (ref 3.87–5.11)
RDW: 12.9 % (ref 11.5–15.5)
WBC: 16.5 10*3/uL — ABNORMAL HIGH (ref 4.0–10.5)

## 2016-07-31 LAB — URINALYSIS, ROUTINE W REFLEX MICROSCOPIC

## 2016-07-31 LAB — I-STAT CHEM 8, ED
BUN: 14 mg/dL (ref 6–20)
CREATININE: 0.8 mg/dL (ref 0.44–1.00)
Calcium, Ion: 1.13 mmol/L — ABNORMAL LOW (ref 1.15–1.40)
Chloride: 103 mmol/L (ref 101–111)
Glucose, Bld: 103 mg/dL — ABNORMAL HIGH (ref 65–99)
HEMATOCRIT: 47 % — AB (ref 36.0–46.0)
HEMOGLOBIN: 16 g/dL — AB (ref 12.0–15.0)
Potassium: 3.5 mmol/L (ref 3.5–5.1)
SODIUM: 138 mmol/L (ref 135–145)
TCO2: 21 mmol/L (ref 0–100)

## 2016-07-31 LAB — URINALYSIS, MICROSCOPIC (REFLEX)

## 2016-07-31 MED ORDER — FENTANYL CITRATE (PF) 100 MCG/2ML IJ SOLN
50.0000 ug | Freq: Once | INTRAMUSCULAR | Status: AC
  Administered 2016-07-31: 50 ug via INTRAVENOUS
  Filled 2016-07-31: qty 2

## 2016-07-31 MED ORDER — NITROFURANTOIN MONOHYD MACRO 100 MG PO CAPS: 100.0000 mg | ORAL_CAPSULE | Freq: Two times a day (BID) | ORAL | 0 refills | Status: AC

## 2016-07-31 MED ORDER — HALOPERIDOL LACTATE 5 MG/ML IJ SOLN
2.0000 mg | Freq: Once | INTRAMUSCULAR | Status: AC
  Administered 2016-07-31: 2 mg via INTRAVENOUS
  Filled 2016-07-31: qty 1

## 2016-07-31 MED ORDER — DICYCLOMINE HCL 10 MG/ML IM SOLN
20.0000 mg | Freq: Once | INTRAMUSCULAR | Status: AC
  Administered 2016-07-31: 20 mg via INTRAMUSCULAR
  Filled 2016-07-31: qty 2

## 2016-07-31 MED ORDER — KETOROLAC TROMETHAMINE 30 MG/ML IJ SOLN
15.0000 mg | Freq: Once | INTRAMUSCULAR | Status: AC
  Administered 2016-07-31: 15 mg via INTRAVENOUS
  Filled 2016-07-31: qty 1

## 2016-07-31 MED ORDER — HYDROMORPHONE HCL 1 MG/ML IJ SOLN
1.0000 mg | Freq: Once | INTRAMUSCULAR | Status: AC
  Administered 2016-07-31: 1 mg via INTRAVENOUS
  Filled 2016-07-31: qty 1

## 2016-07-31 MED ORDER — TAMSULOSIN HCL 0.4 MG PO CAPS
0.4000 mg | ORAL_CAPSULE | Freq: Every day | ORAL | Status: DC
  Administered 2016-07-31: 0.4 mg via ORAL
  Filled 2016-07-31 (×2): qty 1

## 2016-07-31 MED ORDER — NITROFURANTOIN MONOHYD MACRO 100 MG PO CAPS
100.0000 mg | ORAL_CAPSULE | Freq: Once | ORAL | Status: AC
  Administered 2016-07-31: 100 mg via ORAL
  Filled 2016-07-31: qty 1

## 2016-07-31 MED ORDER — KETOROLAC TROMETHAMINE 30 MG/ML IJ SOLN: 15.0000 mg | Freq: Once | INTRAMUSCULAR | Status: DC

## 2016-07-31 MED ORDER — TRAMADOL HCL 50 MG PO TABS: 50.0000 mg | ORAL_TABLET | Freq: Four times a day (QID) | ORAL | 0 refills | Status: AC | PRN

## 2016-07-31 NOTE — ED Provider Notes (Signed)
[facility]-EMERGENCY DEPT Provider Note   CSN: [id number] Arrival date & time: 07/31/16  0251     History   Chief Complaint Chief Complaint  Patient presents with  . Urinary Retention    HPI Rhonda LukesCynthia Castro is a 41 y.o. female.  The history is provided by the patient.  Abdominal Pain   This is a recurrent problem. The current episode started 3 to 5 hours ago. The problem occurs constantly. The problem has not changed since onset.The pain is associated with a previous surgery. The pain is located in the suprapubic region. The quality of the pain is sharp. The pain is severe. Pertinent negatives include vomiting, constipation, dysuria and frequency. Associated symptoms comments: Urinary retention. Nothing aggravates the symptoms. Nothing relieves the symptoms. Past workup includes surgery. Her past medical history does not include PUD.    Past Medical History:  Diagnosis Date  . Acne vulgaris   . Alcohol abuse   . Anxiety   . Depression   . Diabetes mellitus without complication (HCC)   . Hypertension     Patient Active Problem List   Diagnosis Date Noted  . Gastroesophageal reflux disease without esophagitis 09/22/2014  . Essential hypertension 09/22/2014  . Anxiety and depression 09/22/2014  . Hidradenitis 09/22/2014  . H/O ETOH abuse     History reviewed. No pertinent surgical history.  OB History    No data available       Home Medications    Prior to Admission medications   Medication Sig Start Date End Date Taking? Authorizing Provider  acetaminophen (TYLENOL) 500 MG tablet Take 500 mg by mouth every 6 (six) hours as needed for mild pain.   Yes [provider]  oxybutynin (DITROPAN-XL) 10 MG 24 hr tablet Take 10 mg by mouth daily.   Yes [provider]  tamsulosin (FLOMAX) 0.4 MG CAPS capsule Take 0.4 mg by mouth daily.   Yes [provider]  traMADol (ULTRAM) 50 MG tablet Take 50 mg by mouth every 6 (six) hours as needed for moderate  pain.   Yes [provider]  doxycycline (VIBRA-TABS) 100 MG tablet Take 1 tablet (100 mg total) by mouth daily. Patient not taking: Reported on 07/31/2016 01/29/15   Valarie ConesWeber, Dema SeverinSarah L, PA-C  FLUoxetine (PROZAC) 20 MG capsule TAKE 3 CAPSULES BY MOUTH EVERY DAY Patient not taking: Reported on 07/31/2016 06/06/15   Ethelda ChickSmith, Kristi M, MD  hydrochlorothiazide (HYDRODIURIL) 12.5 MG tablet Take 1 tablet (12.5 mg total) by mouth daily. Patient not taking: Reported on 07/31/2016 02/23/15   Ethelda ChickSmith, Kristi M, MD    Family History Family History  Problem Relation Age of Onset  . Thyroid disease Mother   . COPD Father   . Anxiety disorder Father     Social History Social History  Substance Use Topics  . Smoking status: Current Every Day Smoker    Packs/day: 1.00    Years: 19.00    Types: Cigarettes  . Smokeless tobacco: Never Used  . Alcohol use 0.0 oz/week     Allergies   Codeine; Vicodin [hydrocodone-acetaminophen]; Vistaril [hydroxyzine hcl]; Aleve [naproxen sodium]; and Penicillins   Review of Systems Review of Systems  Respiratory: Negative for shortness of breath.   Gastrointestinal: Positive for abdominal pain. Negative for constipation and vomiting.  Genitourinary: Negative for dysuria and frequency.  All other systems reviewed and are negative.    Physical Exam Updated Vital Signs BP (!) 193/88 (BP Location: Right Arm)   Pulse 80   Temp 97.6 F (  36.4 C) (Oral)   Resp 20   Ht 5\' 6"  (1.676 m)   Wt 61.2 kg (134 lb 14.4 oz)   SpO2 94%   BMI 21.77 kg/m   Physical Exam  Constitutional: She is oriented to person, place, and time. She appears well-developed and well-nourished. No distress.  HENT:  Head: Normocephalic and atraumatic.  Mouth/Throat: No oropharyngeal exudate.  Eyes: Pupils are equal, round, and reactive to light. Conjunctivae are normal.  Neck: Normal range of motion. Neck supple.  Cardiovascular: Normal rate, regular rhythm, normal heart sounds and  intact distal pulses.   Pulmonary/Chest: Effort normal and breath sounds normal. No respiratory distress. She has no wheezes. She has no rales.  Abdominal: Soft. Bowel sounds are normal. She exhibits no mass. There is no tenderness. There is no rebound and no guarding.  Musculoskeletal: Normal range of motion.  Neurological: She is alert and oriented to person, place, and time.  Skin: Skin is warm and dry. Capillary refill takes less than 2 seconds.  Psychiatric: She has a normal mood and affect.     ED Treatments / Results   Vitals:   07/31/16 0507 07/31/16 0633  BP: (!) 207/95 (!) 193/88  Pulse: 74 80  Resp: (!) 24 20  Temp:      Labs (all labs ordered are listed, but only abnormal results are displayed)  Results for orders placed or performed during the hospital encounter of 07/31/16  CBC with Differential/Platelet  Result Value Ref Range   WBC 16.5 (H) 4.0 - 10.5 K/uL   RBC 4.75 3.87 - 5.11 MIL/uL   Hemoglobin 15.7 (H) 12.0 - 15.0 g/dL   HCT 04.543.7 40.936.0 - 81.146.0 %   MCV 92.0 78.0 - 100.0 fL   MCH 33.1 26.0 - 34.0 pg   MCHC 35.9 30.0 - 36.0 g/dL   RDW 91.412.9 78.211.5 - 95.615.5 %   Platelets 214 150 - 400 K/uL   Neutrophils Relative % 88 %   Neutro Abs 14.3 (H) 1.7 - 7.7 K/uL   Lymphocytes Relative 7 %   Lymphs Abs 1.2 0.7 - 4.0 K/uL   Monocytes Relative 5 %   Monocytes Absolute 0.9 0.1 - 1.0 K/uL   Eosinophils Relative 0 %   Eosinophils Absolute 0.0 0.0 - 0.7 K/uL   Basophils Relative 0 %   Basophils Absolute 0.0 0.0 - 0.1 K/uL  I-Stat Chem 8, ED  Result Value Ref Range   Sodium 138 135 - 145 mmol/L   Potassium 3.5 3.5 - 5.1 mmol/L   Chloride 103 101 - 111 mmol/L   BUN 14 6 - 20 mg/dL   Creatinine, Ser 2.130.80 0.44 - 1.00 mg/dL   Glucose, Bld 086103 (H) 65 - 99 mg/dL   Calcium, Ion 5.781.13 (L) 1.15 - 1.40 mmol/L   TCO2 21 0 - 100 mmol/L   Hemoglobin 16.0 (H) 12.0 - 15.0 g/dL   HCT 46.947.0 (H) 62.936.0 - 52.846.0 %   Ct Abdomen Pelvis Wo Contrast  Result Date: 07/15/2016 CLINICAL DATA:   Right abdomen pain and hematuria for 8 months. EXAM: CT ABDOMEN AND PELVIS WITHOUT CONTRAST TECHNIQUE: Multidetector CT imaging of the abdomen and pelvis was performed following the standard protocol without IV contrast. COMPARISON:  None. FINDINGS: Lower chest: No acute abnormality. Hepatobiliary: No focal liver abnormality is seen. Gallstones are identified the gallbladder. No gallbladder wall thickening, or biliary dilatation. Pancreas: Unremarkable. No pancreatic ductal dilatation or surrounding inflammatory changes. Spleen: Spleen measures 14.5 cm in length, enlarged. No focal  splenic lesion is identified. 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 There is a 1 cm stone in the right renal pelvis causing mild right hydronephrosis. No ureteral stones are identified bilaterally. The adrenal glands are normal. Partial fluid-filled bladder is unremarkable. Stomach/Bowel: Stomach is within normal limits. Appendix appears normal. No evidence of bowel wall thickening, distention, or inflammatory changes. Vascular/Lymphatic: Aortic atherosclerosis. No enlarged abdominal or pelvic lymph nodes. Reproductive: An IUD is identified in the uterus. There is a 4.2 x 7.3 cm cyst in the right adnexa, likely follicular cyst. Other: Minimal umbilical herniation of mesenteric fat is identified. Musculoskeletal: No acute abnormality. IMPRESSION: 1 cm stone in the right renal pelvis causing mild right hydronephrosis. Bilateral medullary nephrolithiasis identified. Electronically Signed   By: Sherian Rein M.D.   On: 07/15/2016 15:15    Radiology No results found.  Procedures Procedures (including critical care time)  Medications Ordered in ED Medications  tamsulosin (FLOMAX) capsule 0.4 mg (not administered)  ketorolac (TORADOL) 30 MG/ML injection 15 mg (not administered)  dicyclomine (BENTYL) injection 20 mg (20 mg Intramuscular Given 07/31/16 0511)  haloperidol lactate (HALDOL) injection  2 mg (2 mg Intravenous Given 07/31/16 0507)  fentaNYL (SUBLIMAZE) injection 50 mcg (50 mcg Intravenous Given 07/31/16 0555)    Case d/w Dr. Fransisca Connors of urology. Follow up with Dr. Logan Bores tomorrow.  This is irritation related to the stent. No indication for admission or transfer, continue flomax and ditropan.     Final Clinical Impressions(s) / ED Diagnoses  Spasm: Patient informed of this plan and to take all antibiotics.  Strict return precautions given for weakness, difficulty ambulating, inability to tolerate oral medication, worsening pain, fevers, vomiting, decreased urine output, or any concerns. No signs of systemic illness or infection. The patient is nontoxic-appearing on exam and vital signs are within normal limits.   I have reviewed the triage vital signs and the nursing notes. Pertinent labs &imaging results that were available during my care of the patient were reviewed by me and considered in my medical decision making (see chart for details).  After history, exam, and medical workup I feel the patient has been appropriately medically screened and is safe for discharge home. Pertinent diagnoses were discussed with the patient. Patient was given return precautions.   Follow up with Dr. Lavona Mound, Kamren Heintzelman, MD 07/31/16 [phone]

## 2016-07-31 NOTE — ED Triage Notes (Signed)
Pt had stents placed in her kidney two days ago  She hasn't had any urine since midnight and she's in a lot of pain Pt took a tramadol at 2300 with no relief

## 2017-05-29 ENCOUNTER — Encounter: Payer: Self-pay | Admitting: Family Medicine

## 2019-03-30 IMAGING — CT CT ABD-PELV W/O CM
1 of 2 series · 15 of 32 positions shown, 19 images · non-contrast
Comparison: None.

CLINICAL DATA: Right abdomen pain and hematuria for 8 months.

EXAM:
CT ABDOMEN AND PELVIS WITHOUT CONTRAST
TECHNIQUE: Multidetector CT imaging of the abdomen and pelvis was performed
following the standard protocol without IV contrast.

[Series 2: abd/pelvis w/(date) · axial · 0.71mm/px · z∈[-451,-36]mm · 15 of 91 slices shown, 19 images]
[im 4/91  soft-tissue]
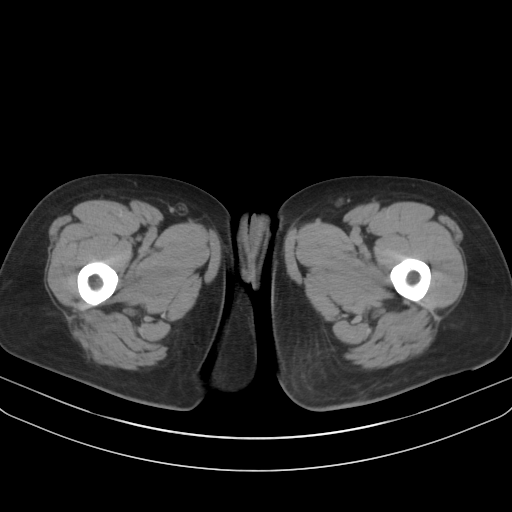
[im 4/91  bone]
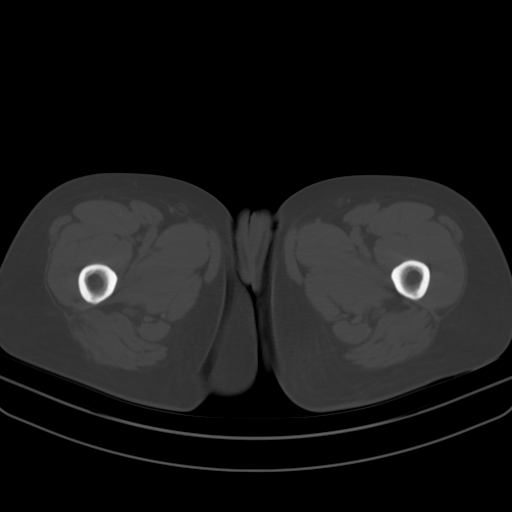
[im 11/91  soft-tissue]
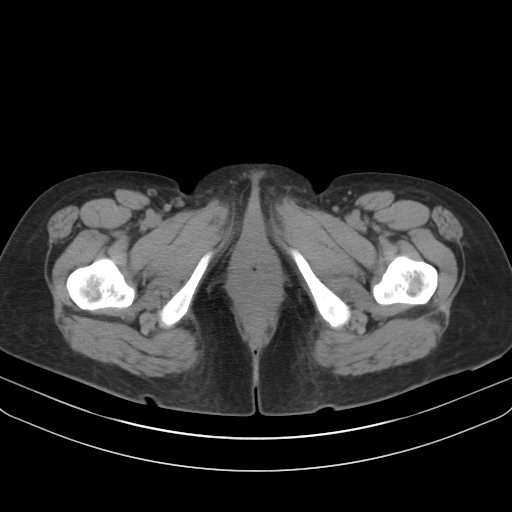
[im 19/91  soft-tissue]
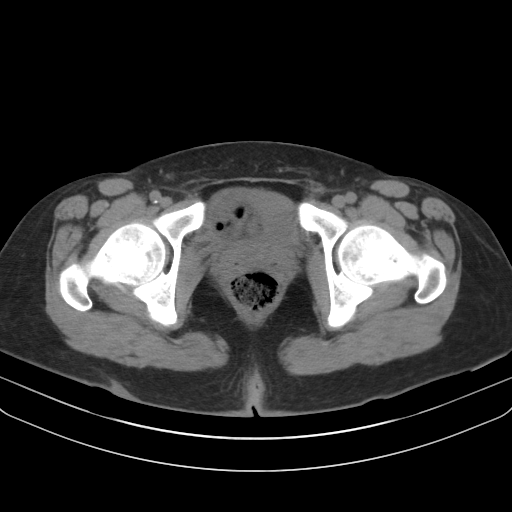
[im 26/91  soft-tissue]
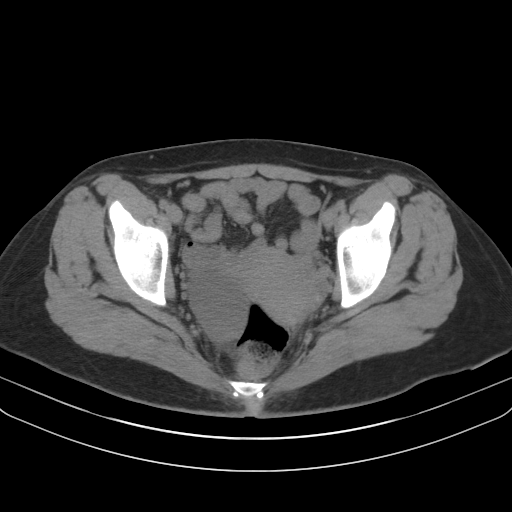
[im 33/91  soft-tissue]
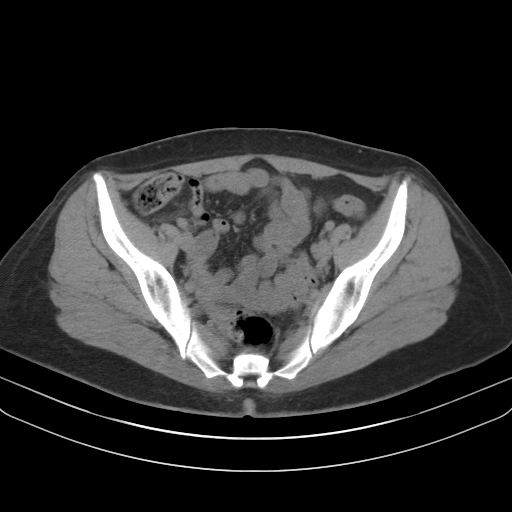
[im 40/91  soft-tissue]
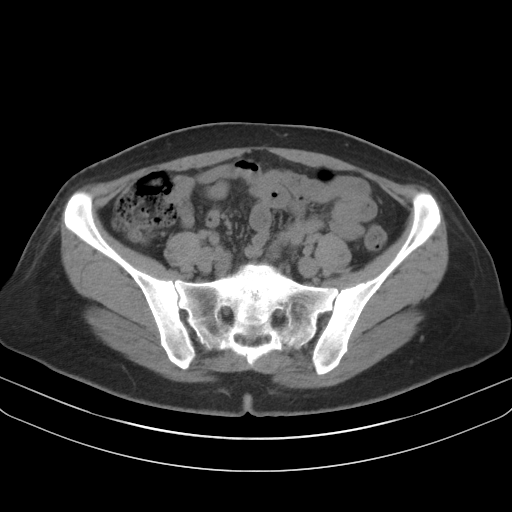
[im 47/91  soft-tissue]
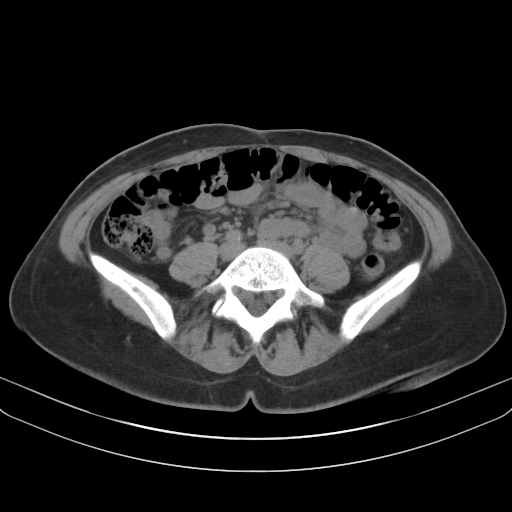
[im 51/91  soft-tissue]
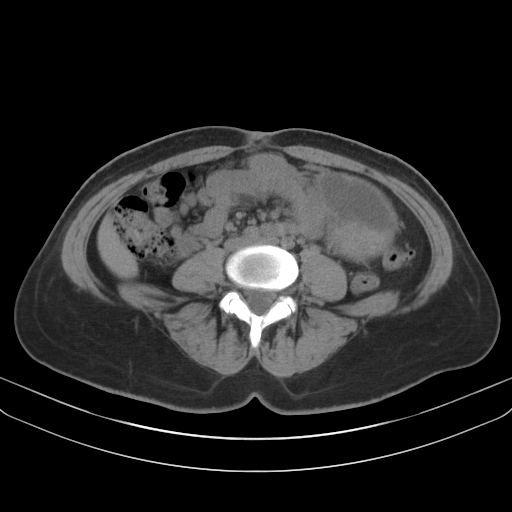
[im 58/91  soft-tissue]
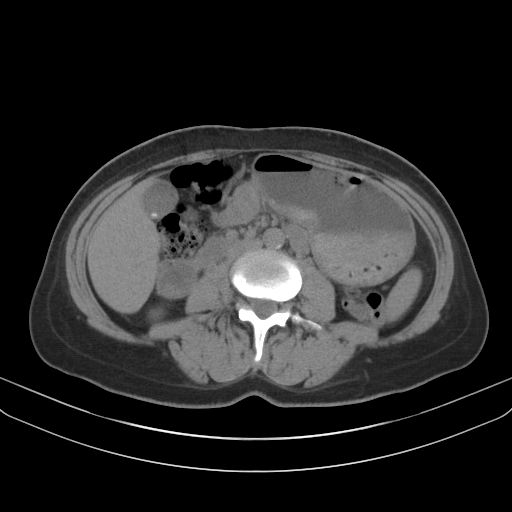
[im 58/91  bone]
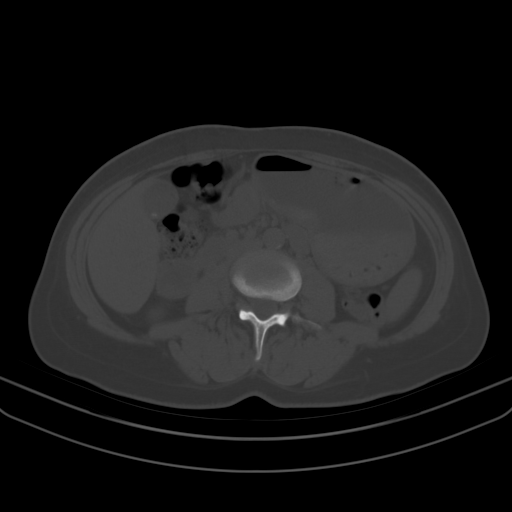
[im 65/91  soft-tissue]
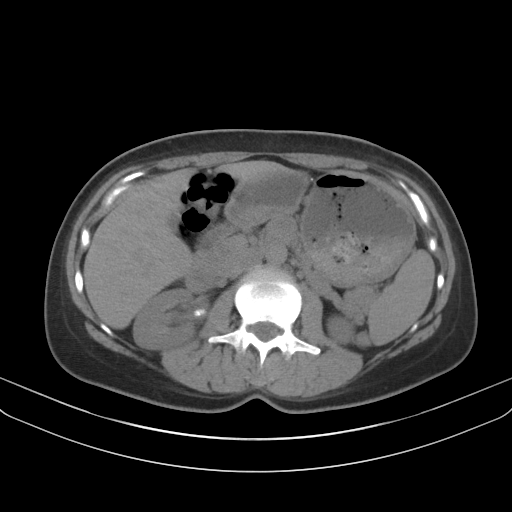
[im 73/91  soft-tissue]
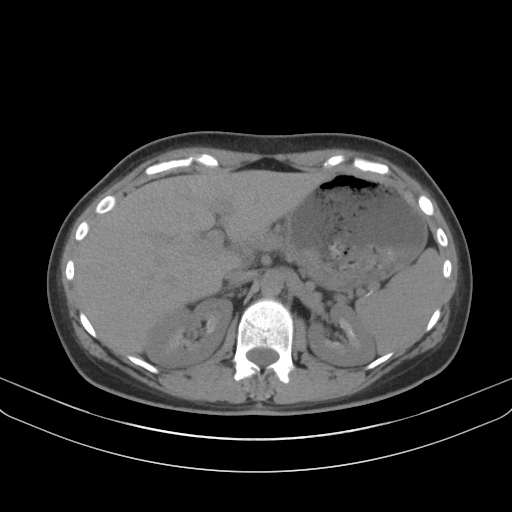
[im 76/91  lung]
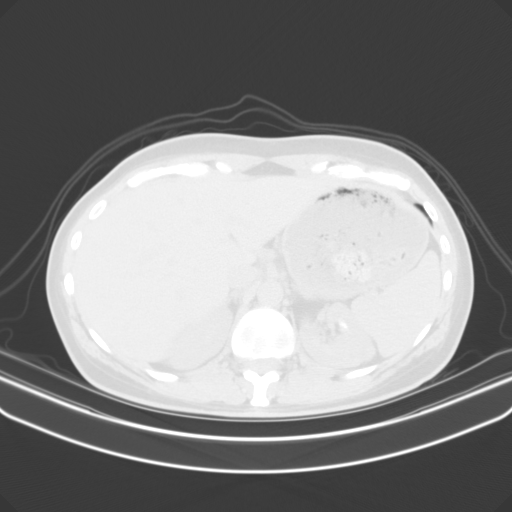
[im 80/91  soft-tissue]
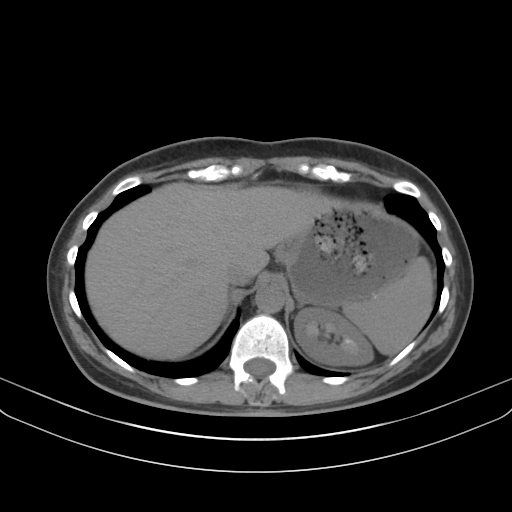
[im 80/91  lung]
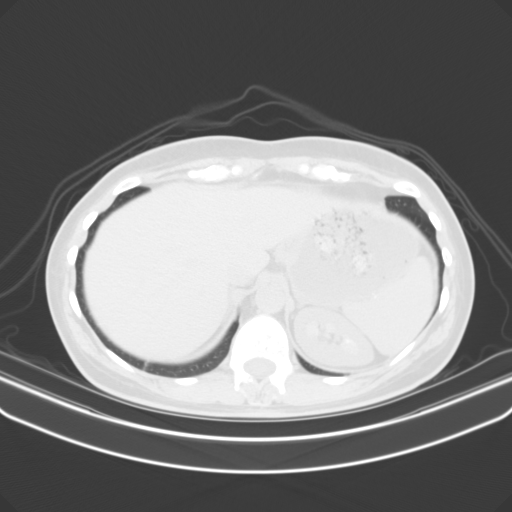
[im 83/91  lung]
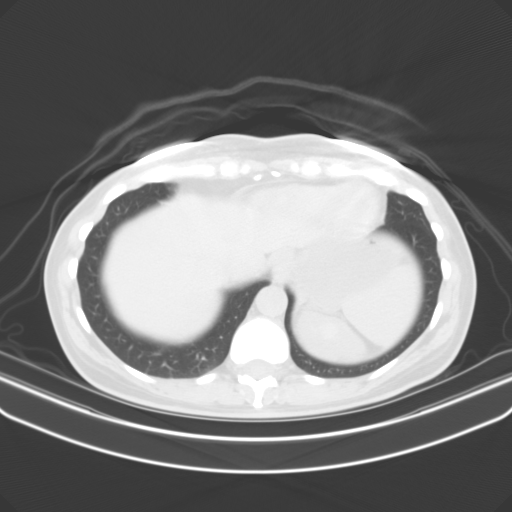
[im 87/91  soft-tissue]
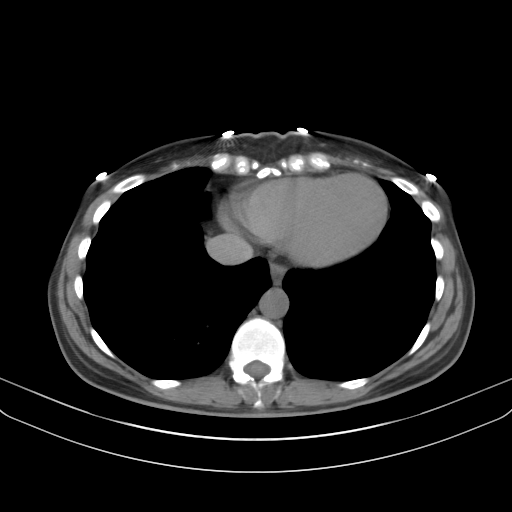
[im 87/91  lung]
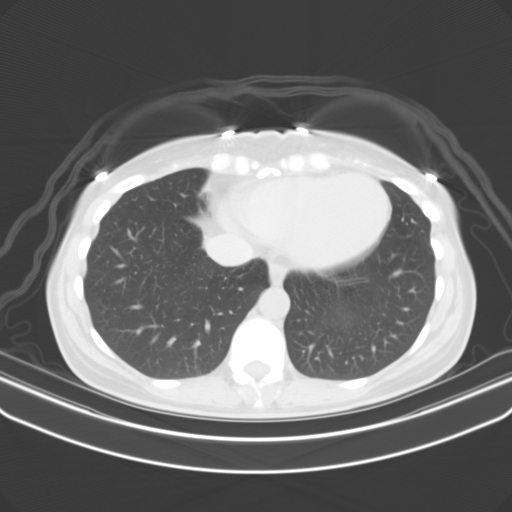

[15 of 32 positions shown; findings below may reference images not displayed]

FINDINGS: Lower chest: No acute abnormality.

Hepatobiliary: No focal liver abnormality is seen. Gallstones are
identified the gallbladder. No gallbladder wall thickening, or
biliary dilatation.

Pancreas: Unremarkable. No pancreatic ductal dilatation or
surrounding inflammatory changes.

Spleen: Spleen measures 14.5 cm in length, enlarged. No focal
splenic lesion is identified.

Adrenals/Urinary Tract: Bilateral medullary nephrolithiasis are
identified. There is a 1 cm stone in the right renal pelvis causing
mild right hydronephrosis. No ureteral stones are identified
bilaterally. The adrenal glands are normal. Partial fluid-filled
bladder is unremarkable.

Stomach/Bowel: Stomach is within normal limits. Appendix appears
normal. No evidence of bowel wall thickening, distention, or
inflammatory changes.

Vascular/Lymphatic: Aortic atherosclerosis. No enlarged abdominal or
pelvic lymph nodes.

Reproductive: An IUD is identified in the uterus. There is a 4.2 x
7.3 cm cyst in the right adnexa, likely follicular cyst.

Other: Minimal umbilical herniation of mesenteric fat is identified.

Musculoskeletal: No acute abnormality.
IMPRESSION: 1 cm stone in the right renal pelvis causing mild right
hydronephrosis.

Bilateral medullary nephrolithiasis identified.

## 2021-11-15 ENCOUNTER — Observation Stay: Admit: 2021-11-15 | Discharge: 2021-11-15

## 2021-11-15 ENCOUNTER — Ambulatory Visit: Admit: 2021-11-15 | Discharge: 2021-11-15

## 2021-11-15 ENCOUNTER — Encounter: Admit: 2021-11-15 | Discharge: 2021-11-15

## 2021-11-15 DIAGNOSIS — R200 Anesthesia of skin: Secondary | ICD-10-CM

## 2021-11-15 DIAGNOSIS — I10 Essential (primary) hypertension: Secondary | ICD-10-CM

## 2021-11-15 DIAGNOSIS — I6521 Occlusion and stenosis of right carotid artery: Secondary | ICD-10-CM

## 2021-11-15 DIAGNOSIS — I639 Cerebral infarction, unspecified: Secondary | ICD-10-CM

## 2021-11-15 LAB — SED RATE: ESR: 28 mm/h — ABNORMAL HIGH (ref 0–20)

## 2021-11-15 LAB — URINALYSIS DIPSTICK REFLEX TO CULTURE
LEUKOCYTES: NEGATIVE
NITRITE: NEGATIVE
URINE ASCORBIC ACID, UA: NEGATIVE

## 2021-11-15 LAB — CBC
HEMATOCRIT: 42 % (ref 36–45)
MCH: 31 pg (ref 26–34)
MCHC: 32 g/dL (ref >60)
MPV: 8.8 FL (ref 7–11)
PLATELET COUNT: 200 K/UL (ref 150–400)
RBC COUNT: 4.4 M/UL (ref 4.0–5.0)
RDW: 13 % (ref 11–15)
WBC COUNT: 9.2 K/UL (ref 4.5–11.0)

## 2021-11-15 LAB — URINALYSIS MICROSCOPIC REFLEX TO CULTURE

## 2021-11-15 LAB — PHOSPHORUS: PHOSPHORUS: 2.9 mg/dL (ref 2.0–4.5)

## 2021-11-15 LAB — C REACTIVE PROTEIN (CRP): C-REACTIVE PROTEIN: 0.1 mg/dL (ref <1.0)

## 2021-11-15 LAB — MAGNESIUM: MAGNESIUM: 1.8 mg/dL (ref 1.6–2.6)

## 2021-11-15 LAB — PTT (APTT)
PTT: 37 s — ABNORMAL HIGH (ref 24.0–36.5)
PTT: 48 s — ABNORMAL HIGH (ref 24.0–36.5)

## 2021-11-15 LAB — HEMOGLOBIN A1C: HEMOGLOBIN A1C: 4.9 % (ref 4.0–5.7)

## 2021-11-15 LAB — CARDIOLIPIN AB IGG/IGM: CARDIOLIPIN IGM: 0.3 [MPL'U]/mL (ref <20.0)

## 2021-11-15 LAB — BASIC METABOLIC PANEL
CHLORIDE: 107 MMOL/L (ref 98–110)
POTASSIUM: 4 MMOL/L (ref 3.5–5.1)
SODIUM: 139 MMOL/L (ref 137–147)

## 2021-11-15 LAB — BETA 2 GLYCOPROTEIN 1 AB, IGM: BETA-2 GLY 1 AB IGM: 0.4 U/mL (ref <20.0)

## 2021-11-15 LAB — BETA 2 GLYCOPROTEIN 1 AB, IGG

## 2021-11-15 MED ORDER — SODIUM CHLORIDE 0.9 % IV SOLP
INTRAVENOUS | 0 refills | Status: DC
Start: 2021-11-15 — End: 2021-11-15

## 2021-11-15 MED ORDER — GADOBENATE DIMEGLUMINE 529 MG/ML (0.1MMOL/0.2ML) IV SOLN
13 mL | Freq: Once | INTRAVENOUS | 0 refills | Status: CP
Start: 2021-11-15 — End: ?
  Administered 2021-11-16: 02:00:00 13 mL via INTRAVENOUS

## 2021-11-15 MED ORDER — LABETALOL 5 MG/ML IV SYRG
5 mg | INTRAVENOUS | 0 refills | Status: AC | PRN
Start: 2021-11-15 — End: ?
  Administered 2021-11-15 – 2021-11-16 (×2): 5 mg via INTRAVENOUS

## 2021-11-15 MED ORDER — HEPARIN (PORCINE) IN 5 % DEX 20,000 UNIT/500 ML (40 UNIT/ML) IV SOLP
0-2000 [IU]/h | INTRAVENOUS | 0 refills | Status: AC
Start: 2021-11-15 — End: ?
  Administered 2021-11-15 – 2021-11-16 (×2): 855 [IU]/h via INTRAVENOUS

## 2021-11-15 MED ORDER — SODIUM CHLORIDE 0.9 % IV SOLP
INTRAVENOUS | 0 refills | Status: AC
Start: 2021-11-15 — End: ?
  Administered 2021-11-16: 06:00:00 1000.000 mL via INTRAVENOUS

## 2021-11-15 NOTE — H&P (View-Only)
Please refer to ASRT note from 11/9 for H&P      Hettie Holstein, APRN-NP  Vascular Neurology  Available on [facility]

## 2021-11-15 NOTE — Progress Notes
OCCUPATIONAL THERAPY  ASSESSMENT NOTE      Name: Megan Vaughn   MRN: [id number]     DOB: 1975-08-24      Age: 46 y.o.  Admission Date: 11/15/2021     LOS: 0 days     Date of Service: 11/15/2021      Mobility  Patient Turn/Position: Supine (HOB elevated)  Progressive Mobility Level: Walk in hallway  Distance Walked (feet): 450 ft  Level of Assistance: Assist X1  Assistive Device: None  Activity Limited By: Fatigue    Subjective  Pertinent Dx per Physician: 47 y.o. female with h/o current smoker and HTN who presented today to OSH after experiencing sudden onset of expressive aphasia and left face heaviness at a hotel while on vacation.  NIHSS 0 upon arrival to OSH, LKW @ 0600. CT head negative for acute process. CTA H/N showing right carotid occlusion.  Moments after getting CT, patient starting having tingling to left face, arm and leg. She was transferred to [facility] and admitted to the stroke service for further work up and monitoring.  Precautions: Falls  Pain / Complaints: Patient agrees to participate in therapy;Patient has no c/o pain  Pain Level Current: No pain  Comments: On entry/exit, pt in bed w/ alarm on and all needs within reach. Pt's son, Gregary Signs, present throughout session and pt's significant other present at end of session. Pt reports feeling very tired. RN notified.    Objective  Psychosocial Status: Willing and Cooperative to Participate  Persons Present: Son;Significant Other    Home Living  Type of Home: House  Home Layout: One Level  Financial risk analyst / Tub: Tub/Shower Unit    Prior Function  Level Of Independence: Independent with ADLs and functional transfers;Independent with homemaking w/ ambulation  Lives With: Family  Receives Help From: None Needed  Vocational: Full Time Employment  Other Function Comments: Prior to admission, pt reports completing ADLs and IADLs independently. Pt reports no prior use of DME and no recents falls. Pt is from [place] and has family support available as needed. Pt has history of HTN and reports not having taken her BP medications due to not being able to get to her local pharmacy during open hours.    Vision  Corrective Lenses: Wears glasses for distance (Pt reports not wearing her glasses.)    ADL's  Where Assessed: Edge of Bed;In Bathroom;Standing at [employer]: Stand By Assist  Grooming Deficits: Supervision/Safety;Wash/Dry Hands  LE Dressing Assist: Stand By Assist  LE Dressing Deficits: Supervision/Safety;Don/Doff R Sock;Don/Doff L Sock  Toileting Assist: Stand By Assist  Toileting Deficits: Supervision/Safety;Clothing Management Up;Clothing Management Down;Perineal Hygiene  Comment: Pt sits EOB to doff personal socks and don hospital socks using figure-four method w/ SBA. Pt completes toileting and hand washing ADL w/ SBA.    ADL Mobility  Bed Mobility: Supine to Sit: Standby assist  Bed Mobility: Sit to Supine: Standby assist  Bed Mobility Comments: HOB elevated during supine to sit transition.  Transfer Type: Sit to stand  Transfer: Assistance Level: To/from;Bed;Minimal assist (CGA)  Transfer: Assistive Device: None  Transfer: Type of Assistance: For balance;For safety considerations;For strength deficit  Other Transfer Type: Sit to stand  Other Transfer: Assistance Level: To/from;Toilet;Standby assist  Other Transfer: Assistive Device: None  Other Transfer: Type of Assistance: For balance;For safety considerations  End of Activity Status: In bed;Instructed patient to request assist with mobility;Instructed patient to use call light;Nursing notified  Sitting Balance: Static sitting balance;Dynamic sitting balance;Standby assist  Standing  Balance: Static standing balance;Dynamic standing balance;Minimal assist (CGA)  Gait Distance: 450 feet  Gait: Assistance Level: Minimal assist;Safety considerations (CGA)  Gait: Assistive Device: None  Gait Comments: Pt ambulates ~462ft in hall w/ CGA. Pt w/ slight unsteadiness but no LOB this date.    Activity Tolerance  Endurance: 3/5 Tolerates 25-30 Minutes Exercise w/Multiple Rests  Comment: BP monitored throughout session. RN notified.     Orthostatic vital signs   Position Blood pressure MAP Heart rate Symptomatic (Y/N)   Supine (HOB elevated)  169/78 99 65 N   Seated EOB 158/89 104 69 N   Standing 151/89 101 80 N   Seated (Post-activity) 150/77 95 67 N     Cognition  Social Interaction: Interacts in a Network engineer  Orientation: Alert & Oriented x4  Attention: Awake/Alert  Cognition Comment: Pt w/ one episode of mumbling a word; otherwise clear speech throughout entire session.    ROM  R UE ROM: WFL   R UE ROM Method: Active  L UE ROM: WFL   L UE ROM Method: Active  Coordination: Adequate to Complete ADLs  Grasp: L Weakened    Sensory  Overall Sensory: Pt Perceives Pressure in Both UEs in Gross Exam;Bilateral Intact  Comment: Pt states her LUE joints feel swollen intermittently; patient reports this has been going on for some time and sometimes extends to her LLE.    UE Strength / Tone  R UE Strength: WFL  R Shoulder Flexion: 5  R Elbow Flexion: 5  R Elbow Extension: 5  L UE Strength: WFL  L Shoulder Flexion: 5  L Elbow Flexion: 5  L Elbow Extension: 5  Strength Comments: Pt is R handed.     Upper extremity outcome measure testing not completed due to patients upper extremity function already within functional limits.    Education  Persons Educated: Patient/Family  Teaching Methods: Verbal Instruction  Patient Response: Verbalized Understanding  Topics: Role of OT, Goals for Therapy  Goal Formulation: With Patient    Assessment  Assessment: Decreased UE Strength;Decreased Endurance;Decreased Self-Care Trans;Decreased High-Level ADLs  Prognosis: Good;w/Cont OT s/p Acute Discharge  Goal Formulation: Patient  Comments: Pt currently limited by L side weakness and decreased endurance. OT will continue to follow to maximize independence and safety w/ ADLs and functional mobility throughout admission.    AM-PAC 6 Clicks Daily Activity Inpatient  Putting on and taking off regular lower body clothes: None  Bathing (Including washing, rinsing, drying): A Little  Toileting, which includes using toilet, bedpan, or urinal: None  Putting on and taking off regular upper body clothing: None  Taking care of personal grooming such as brushing teeth: A Little  Eating meals: None  Daily Activity Raw Score: 22  Standardized (T-scale) Score: 47.1    Plan  OT Frequency: 5x/week  OT Plan for Next Visit: LB dressing (pants), standing ADLs, higher level balance activity, increase endurance    ADL Goals  Patient Will Perform All ADL's: Independently    Functional Transfer Goals  Pt Will Perform All Functional Transfers: Independent    OT Discharge Recommendations  Recommendation: Currently patient requires inpatient level of care. However, typical progression for patient condition would anticipate home with assistance at time of discharge.  Patient Currently Requires Physical Assist With: All personal care ADLs;All home functioning ADLs;Ambulation;Transfers  Patient Currently Requires Supervision For: ADLs;Mobility  Comments: Anticipate pt may be able to dc home/prior living situation with continued improvement in medical status and continued participation in therapy.  Therapist: Burnadette Pop, OTD, OTR/L  Date: 11/15/2021

## 2021-11-15 NOTE — Progress Notes
PHYSICAL THERAPY  NOTE      Name: Megan Vaughn   MRN: [id number]     DOB: [birth year]      Age: 46 y.o.  Admission Date: 11/15/2021     LOS: 0 days     Date of Service: 11/15/2021      Based on discussion with OT, patient's current level of function suggests that patient will progress with one discipline. PT will sign off at this time. Please re-consult if a change in functional status occurs.    Therapist: Baird Kay, PT, DPT  Date: 11/15/2021

## 2021-11-15 NOTE — Progress Notes
27y F in ED @ [facility] w/ acute stroke, onset at 0600. Son found pt to have garbled speech, left face and body sided tingling.     Speech normal in ED    Subjective numbness present     NIH 1    BP 190/100    CT head normal    CTA: occlusion of right carotid artery    PMH: HTN Smoking

## 2021-11-15 NOTE — Acute Stroke Response
Name: Nixie Jokinen   MRN: [id number]     DOB: 1975-09-10      Age: 46 y.o.  Admission Date: (Not on file)     LOS: 0 days     Date of Service: 11/15/2021       Allergies:  Patient has no allergy information on record.    Type of Acute Stroke Response Team note: Consult    Stroke Activation Tier: Tier 1         Assessment & Plan    Chief Complaint: expressive aphasia, left eye/cheek heaviness   Assessment: Siyona Basaldua is a 46 y.o. female with h/o current smoker and HTN who presented today to OSH after experiencing sudden onset of expressive aphasia and left face heaviness at a hotel while on vacation.  NIHSS 0 upon arrival to OSH, LKW @ 0600. CT head negative for acute process.  CTA H/N showing right carotid occlusion.  Moments after getting CT, patient starting having tingling to left face, arm and leg.  She was given a heparin bolus followed by gtt and ASA.  She was also noted to be hypertensive @ 201/99 and reportedly hasn't taken her lisinopril in 3 weeks due to not being able to get to the pharmacy.  She was transferred to [facility] and admitted to the stroke service for further work up and monitoring.      Impression: R ICA occlusion    Suspected localization of Stroke Sx: L MCA territory    Suspected etiology: Large - Artery Atherosclerosis    Pre-event Modified Rankin Scale (mRS): 0 - No symptoms at all     Plan:   - Admit to neurology  - MRI head without contrast, MRA head and neck with contrast (ordered)  - CT head @ 1800 for stability  - Continue heparin gtt w/ no bolus, obtain initial PTT  - Low threshold to repeat CT in neuro exam changes  - Stroke risk factor assessment    Labs to include FLP, A1c   Echocardiogram    Vascular Imaging (obtained @ OSH)   Telemetry to monitor for arrhythmia    Evaluation of smoking history and smoking cessation consult if tobaccoism present    Antiplt therapy: Heparin gtt  - CBC, BMP routine   - PT/OT/SLP consult eval and treat   - Rehab consult for assessment of post stroke care  - PPX: SCDs for DVT prevention       The patient was seen and discussed with Dr. Laureen Ochs    History of Present Illness    History of Present Illness: Hibah Giacona is a 46 y.o. female with h/o current smokerand HTN who presented today to OSH after experiencing sudden onset of expressive aphasia and left face heaviness at a hotel while on vacation.  NIHSS 0 upon arrival to OSH, LKW @ 0600. CT head negative for acute process.  CTA H/N showing right carotid occlusion.  Moments after getting CT, patient starting having tingling to left face, arm and leg.  She was given a heparin bolus followed by gtt and ASA.  She was also noted to be hypertensive @ 201/99 and reportedly hasn't taken her lisinopril in 3 weeks due to not being able to get to the pharmacy.  She was transferred to [facility] and admitted to the stroke service for further work up and monitoring.      Patient was doing well when she arrived at [facility].  She states that they have been driving the last couple of days  and are from [place], headed to [place].  They were staying in a hotel room last night passing through.     Review of Systems    A 14 point review of systems was negative except for: Cardiovascular: positive for HTN  Neurological: positive for speech problems and paresthesia      Stroke Activation Summary    CT/CTP/CTA: obtained @ OSH       NIHSS Completed at: 1155      NIH Stroke Scale Item  Scoring Definition  Score    1a. LOC  0=alert and responsive   1=arousable to minor stimulation   2=arousable only to painful stimulation   3=reflex responses or unrousable  0    1b. LOC questions-as patient?s age and month. Must be exact.  0=both correct   1=one correct (or dysarthria, intubated, foreign language)   2=neither correct  0    1c. Commands-open/close eyes, grip and release non-paretic hand (other 1 step commands or mimic OK)  0=both correct (ok if impaired by weakness)   1=one correct   2=neither correct  0    2. Best Gaze-horizontal EOM by voluntary or Doll?s  0=normal   1=partial gaze palsy (abnormal gaze in one or both eyes)   2=forced eye deviation or total paresis which cannot be overcome by Doll?s  0    3. Visual Field-use visual threat if necessary. If monocular, score field of good eye  0=no visual loss   1=partial hemianopia, quadrantanopia, extinction   2=complete hemianopia   3=bilateral hemianopia or blindness  0    4. Facial Palsy-if stuporous, check symmetry of grimace to pain  0=normal   1=minor paralysis, flat NLF, asymm smile   2=partial paralysis (lower face=UMN)   3=complete paralysis (upper and lower face)  0    5. Motor Arm-arms outstretched 90 deg (sitting) or 45 deg (supine) for 10 seconds. Encourage best effort.  0=no drift x 10 seconds   1=drift but doesn?t hit bed   2=some antigravity effort, but can?t sustain   3=no antigravity effort, but even minimal mvt counts   4=no movement at all   X=unable to assess due to amputation, fusion, etc  L/R   0/0    6. Motor Leg-raise leg to 30 degrees supine x 5 seconds  0=no drift x 5 seconds   1=drift but doesn?t hit bed   2=some antigravity effort, but can?t sustain   3=no antigravity effort, but even minimal mvt counts   4=no movement at all   X=unable to assess due to amputation, fusion, etc  L/R   0/0    7. Limb Ataxia-check finger-nose- finger; heel-shin; and score only if out of proportion to paralysis  0=no ataxia (or aphasic, hemiplegic)   1=ataxia in upper or lower extremity   2=ataxia in upper AND lower extremity   X=unable to assess due to amputation, fusion, etc  0   8. Sensory-use safety pin. Check grimace or withdrawal if stuporous. Score only stroke- related losses  0=normal   1=mild-mod unilateral loss but patient aware of touch 9or aphasic, confused)   2=total loss, pt unaware of touch. Coma, bilateral loss  1   9. Best Language-describe cookie jar picture, name objects, read sentences. May use repeating, writing, stereognosis  0=normal   1=mild-mod aphasia (diff but partly comprehensible)   2=severe aphasia (almost no info exchanged)   3=mute, global aphasia, coma. No 1 step commands  0    10. Dysarthria-read list of words  0=normal or  intubated or other physical barrier  1=mild-mod; slurred but intelligible   2=severe; unintelligible or mute  0    11. Extinction/Neglect- simultaneously touch patient on both hands, show fingers in both visual fields, ask about deficit, left hand  0=normal, none detected. (visual loss alone)   1=neglects or extinguishes to double stimulation in any modality   2=profound neglect in more than one modality  0    Score    1     Was the patient 4.5-9 hrs from last known well or a wake-up stroke? No    Was IV tenecteplase given? No    The patient was not a thrombolytic candidate due to Stroke severity too mild (non-disabling)    The patient was not a thrombectomy candidate due to no LVO     Dysphagia screen: Performed by nursing staff and passed    Cardiac rhythm on presentation: UTA         Health History    No past medical history on file.  No past surgical history on file.  No family history on file.              Medications:      PRN Medications:         Physical Exam    HEENT: normocephalic, eyes open with no discharge, nares patent, oropharynx is clear with no lesions, palate intact  CV: regular rate, distal pulses palpable  Chest: normal configuration, equal chest rise bilaterally  Ab: soft, non-tender, no masses, no organomegaly  Skin: no rashes or lesions    Extended Neuro Exam:    Mental status: alert, oriented to person/place/time  Speech:    Normal Abnormal   Fluency x    Comprehension x    Articulation x    Repetition x    Naming x        Cranial Nerves:    Normal Abnormal   II x    III, IV, VI x    V x    VII                        x                             VIII x    IX, X x    XI x    XII x        Muscle/motor:   Tone: nml  Bulk: nml  Fasciculations: none  Pronator drift: none     NF NE SA EF EE WE WF FF FE FA TA HF HA HE KF KE DF PF   R 5 5 5 5 5 5 5 5 5 5 5 5 5 5 5 5 5 5    L   5 5 5 5 5 5 5 5 5 5 5 5 5 5 5 5        Sensation:    Normal RUE LUE RLE LLE   Light Touch   tingling     Pin Prick x           Coordination:    Normal Abnormal Right Abnormal Left   Finger to Nose x     Rapid alternating  x     Heel to Shin x     Finger tap x     Foot tap x       Gait and Station:  Regular gait: normal  Lab/Radiology/Other Diagnostic Tests:  Pertinent labs reviewed     Pertinent radiology reviewed.      I spent 44 minutes of critical care time managing the care of this patient. Edilia is critically ill with an acute stroke, her cares included: preparing to see the patient, detailed neurologic and systems exam including NIHSS and reflex exam, obtaining and/or reviewing separately obtained history, medication review, laboratory data review and interpretation, review of available and obtained imaging, referring and communication with other health care professionals (when not separately reported), documenting clinical information in the electronic or other health record and Independently interpreting results (not separately reported) and communicating results to the patient/family/caregiver.     Hettie Holstein, APRN-NP  Vascular Neurology  Available on [facility]

## 2021-11-15 NOTE — Progress Notes
RT Adult Assessment Note    NAME:Megan Vaughn             MRN: [id number]             DOB:1975/07/07          AGE: 46 y.o.  ADMISSION DATE: 11/15/2021             DAYS ADMITTED: LOS: 0 days    RT Treatment Plan:     Criteria Not Met       Additional Comments:  Impressions of the patient: pt resting in bed, NAD  Intervention(s)/outcome(s): RT assessment  Patient education that was completed: none  Recommendations to the care team: none    Vital Signs:  Pulse: 59  RR: 18 PER MINUTE  SpO2: 96 %  O2 Device: None (Room air)  Liter Flow:    O2%:      Breath Sounds:    Respiratory Effort:

## 2021-11-16 ENCOUNTER — Inpatient Hospital Stay: Admit: 2021-11-16 | Discharge: 2021-11-16 | Payer: BC Managed Care – PPO

## 2021-11-16 ENCOUNTER — Observation Stay: Admit: 2021-11-16 | Discharge: 2021-11-16 | Payer: BC Managed Care – PPO

## 2021-11-16 ENCOUNTER — Encounter: Admit: 2021-11-16 | Discharge: 2021-11-16 | Payer: BC Managed Care – PPO

## 2021-11-16 MED ORDER — PROPOFOL 10 MG/ML IV EMUL 20 ML (INFUSION)(AM)(OR)
INTRAVENOUS | 0 refills | Status: DC
Start: 2021-11-16 — End: 2021-11-16
  Administered 2021-11-16: 20:00:00 140 ug/kg/min via INTRAVENOUS

## 2021-11-16 MED ORDER — LIDOCAINE (PF) 20 MG/ML (2 %) IJ SOLN
INTRAVENOUS | 0 refills | Status: DC
Start: 2021-11-16 — End: 2021-11-16

## 2021-11-16 MED ORDER — PHENYLEPHRINE HCL IN 0.9% NACL 1 MG/10 ML (100 MCG/ML) IV SYRG
INTRAVENOUS | 0 refills | Status: DC
Start: 2021-11-16 — End: 2021-11-16

## 2021-11-16 MED ORDER — FENTANYL CITRATE (PF) 50 MCG/ML IJ SOLN
25 ug | INTRAVENOUS | 0 refills | PRN
Start: 2021-11-16 — End: ?

## 2021-11-16 MED ORDER — HALOPERIDOL LACTATE 5 MG/ML IJ SOLN
1 mg | Freq: Once | INTRAVENOUS | 0 refills | PRN
Start: 2021-11-16 — End: ?

## 2021-11-16 MED ORDER — PROPOFOL INJ 10 MG/ML IV VIAL
INTRAVENOUS | 0 refills | Status: DC
Start: 2021-11-16 — End: 2021-11-16

## 2021-11-16 MED ORDER — SODIUM CHLORIDE 0.9 % IV SOLP (OR) 500ML
INTRAVENOUS | 0 refills | Status: DC
Start: 2021-11-16 — End: 2021-11-16

## 2021-11-16 MED ADMIN — ATORVASTATIN 40 MG PO TAB [77113]: 80 mg | ORAL | NDC 00904629261

## 2021-11-16 MED ADMIN — ASPIRIN 325 MG PO TBEC [13654]: 325 mg | ORAL | Stop: 2021-11-16 | NDC 00536123201

## 2021-11-16 MED ADMIN — CLOPIDOGREL 75 MG PO TAB [78966]: 300 mg | ORAL | Stop: 2021-11-16 | NDC 00904629461

## 2021-11-16 NOTE — Anesthesia Post-Procedure Evaluation
Post-Anesthesia Evaluation    Name: Megan Vaughn      MRN: [id number]     DOB: 09/14/1975     Age: 46 y.o.     Sex: female   __________________________________________________________________________     Procedure Information     Anesthesia Start Date/Time: 11/16/21 1342    Scheduled providers: Currie Paris, MD; Patricia Pesa, RN    Procedure: TRANSESOPHAGEAL ECHO    Location: Cardiovascular Medicine: [facility]          Post-Anesthesia Vitals  BP: 185/88 (11/10 1435)  Pulse: 58 (11/10 1435)  Respirations: 15 PER MINUTE (11/10 1435)  SpO2: 98 % (11/10 1435)  O2 Device: None (Room air) (11/10 1435)  Height: 167.6 cm (5' 5.98") (11/10 1430)   Vitals Value Taken Time   BP 185/88 11/16/21 1435   Temp     Pulse 58 11/16/21 1435   Respirations 15 PER MINUTE 11/16/21 1435   SpO2 98 % 11/16/21 1435   O2 Device None (Room air) 11/16/21 1435   ABP     ART BP           Post Anesthesia Evaluation Note    Evaluation location: pre/post  Patient participation: recovered; patient participated in evaluation  Level of consciousness: alert    Pain score: 0  Pain management: adequate    Hydration: normovolemia  Temperature: 36.0C - 38.4C  Airway patency: adequate    Perioperative Events       Post-op nausea and vomiting: no PONV    Postoperative Status  Cardiovascular status: hemodynamically stable  Respiratory status: spontaneous ventilation  Follow-up needed: none        Perioperative Events  There were no known notable events for this encounter.

## 2021-11-16 NOTE — Progress Notes
Report Sheet    TEE           Indication: Embolic Event  Ordering Provider: Mirna Mires    Name: Megan Vaughn     Age: 46 y.o.    DOB: 02-01-1975     MRN: [id number]    RM #: [id number]     RN: Rochel    Phone #: [phone]    Isolation: None    Communication Barrier: None    NPO MN   A&O x4   Travel w/c   IV Hep gtt   O2 RA   Additional Information: has a decubitus on her coccyx    Lab(s) needed: N/A  Other:   Device check needed: No    Device: No results found for: "GENERATOR", "EPDEVTYP"    Prior TEE: N/A     Prior DCCV: N/A     Prior Echo:N/A   Previous TEE/DCCV details: N/A    EF: No results found for: "ECHOEF", "RVGEF", "NUCLVEF", "RVEF"    Anticoag:Hep gtt     Dose:see MAR     Frequency:N/A   Last Dose; N/A     Missed:N/A    LAB:   Lab Results   Component Value Date/Time    PTT 49.3 (H) 11/16/2021 04:22 AM    HGB 13.0 11/16/2021 03:36 AM    PLTCT 183 11/16/2021 03:36 AM    GLU 79 11/16/2021 03:36 AM    NA 140 11/16/2021 03:36 AM    K 3.9 11/16/2021 03:36 AM    CR 0.78 11/16/2021 03:36 AM       Probe   Gastric Surgery    GERD    Swallow difficulty    Head/Neck Surg/Rad    Chest Surg/Rad    EGD    Varices/Esophag CA    GI bleed    Dental issues      Sedation   COPD    OSA/CPAP    Asthma    Pulm HTN    Anesthesia Issues    Smoker    ETOH & Frequency    Drug Use    Chronic Pain Med      Current Medications:   No current outpatient medications on file.       Past Medical/Surgical History:   Patient Active Problem List    Diagnosis Date Noted    Primary hypertension 11/15/2021    ICAO (internal carotid artery occlusion), right 11/15/2021    Numbness and tingling of left arm and leg 11/15/2021    Dysarthria 11/15/2021    No past medical history on file. No past surgical history on file.    Allergies:   Allergies   Allergen Reactions    Amoxicillin RASH    Penicillins RASH     Verner Chol, RN

## 2021-11-17 ENCOUNTER — Encounter: Admit: 2021-11-17 | Discharge: 2021-11-17 | Payer: BC Managed Care – PPO

## 2021-11-17 MED ADMIN — ASPIRIN 81 MG PO TBEC [14113]: 81 mg | ORAL | @ 15:00:00 | Stop: 2021-11-17 | NDC 00536123441

## 2021-11-17 MED ADMIN — ATORVASTATIN 40 MG PO TAB [77113]: 80 mg | ORAL | @ 15:00:00 | Stop: 2021-11-17 | NDC 00904629261

## 2021-11-17 MED ADMIN — ENOXAPARIN 40 MG/0.4 ML SC SYRG [85052]: 40 mg | SUBCUTANEOUS | @ 03:00:00 | NDC 00781324602

## 2021-11-17 MED ADMIN — TICAGRELOR 90 MG PO TAB [307896]: 180 mg | ORAL | @ 19:00:00 | Stop: 2021-11-17 | NDC 00186077739

## 2021-11-17 MED ADMIN — CLOPIDOGREL 75 MG PO TAB [78966]: 75 mg | ORAL | @ 15:00:00 | Stop: 2021-11-17 | NDC 00904629461

## 2021-11-17 MED FILL — TICAGRELOR 90 MG PO TAB: 90 mg | ORAL | 90 days supply | Qty: 180 | Fill #1 | Status: CP

## 2021-11-17 MED FILL — LOSARTAN 25 MG PO TAB: 25 mg | ORAL | 90 days supply | Qty: 90 | Fill #1 | Status: CP

## 2021-11-17 MED FILL — ATORVASTATIN 80 MG PO TAB: 80 mg | ORAL | 90 days supply | Qty: 90 | Fill #1 | Status: CP

## 2021-11-19 ENCOUNTER — Other Ambulatory Visit: Payer: Self-pay

## 2021-11-19 ENCOUNTER — Emergency Department (HOSPITAL_BASED_OUTPATIENT_CLINIC_OR_DEPARTMENT_OTHER)
Admission: EM | Admit: 2021-11-19 | Discharge: 2021-11-19 | Disposition: A | Discharge status: Home / Self Care | Payer: BC Managed Care – PPO | Attending: Emergency Medicine | Admitting: Emergency Medicine

## 2021-11-19 ENCOUNTER — Emergency Department (HOSPITAL_BASED_OUTPATIENT_CLINIC_OR_DEPARTMENT_OTHER): Payer: BC Managed Care – PPO

## 2021-11-19 ENCOUNTER — Encounter (HOSPITAL_BASED_OUTPATIENT_CLINIC_OR_DEPARTMENT_OTHER): Payer: Self-pay

## 2021-11-19 ENCOUNTER — Encounter: Admit: 2021-11-19 | Discharge: 2021-11-19 | Payer: BC Managed Care – PPO

## 2021-11-19 DIAGNOSIS — E119 Type 2 diabetes mellitus without complications: Secondary | ICD-10-CM | POA: Diagnosis not present

## 2021-11-19 DIAGNOSIS — R2232 Localized swelling, mass and lump, left upper limb: Secondary | ICD-10-CM | POA: Diagnosis present

## 2021-11-19 DIAGNOSIS — L03114 Cellulitis of left upper limb: Secondary | ICD-10-CM | POA: Insufficient documentation

## 2021-11-19 DIAGNOSIS — Z79899 Other long term (current) drug therapy: Secondary | ICD-10-CM | POA: Diagnosis not present

## 2021-11-19 HISTORY — DX: Pure hypercholesterolemia, unspecified: E78.00

## 2021-11-19 LAB — PREGNANCY, URINE: Preg Test, Ur: NEGATIVE

## 2021-11-19 LAB — COMPREHENSIVE METABOLIC PANEL
ALT: 21 U/L (ref 0–44)
AST: 33 U/L (ref 15–41)
Albumin: 4.4 g/dL (ref 3.5–5.0)
Alkaline Phosphatase: 78 U/L (ref 38–126)
Anion gap: 12 (ref 5–15)
BUN: 16 mg/dL (ref 6–20)
CO2: 22 mmol/L (ref 22–32)
Calcium: 9.9 mg/dL (ref 8.9–10.3)
Chloride: 104 mmol/L (ref 98–111)
Creatinine, Ser: 0.84 mg/dL (ref 0.44–1.00)
GFR, Estimated: >60 mL/min (ref >60)
Glucose, Bld: 74 mg/dL (ref 70–99)
Potassium: 4.1 mmol/L (ref 3.5–5.1)
Sodium: 138 mmol/L (ref 135–145)
Total Bilirubin: 0.5 mg/dL (ref 0.3–1.2)
Total Protein: 7.7 g/dL (ref 6.5–8.1)

## 2021-11-19 LAB — CBC WITH DIFFERENTIAL/PLATELET
Abs Immature Granulocytes: 0.02 10*3/uL (ref 0.00–0.07)
Basophils Absolute: 0.1 10*3/uL (ref 0.0–0.1)
Basophils Relative: 1 %
Eosinophils Absolute: 0.1 10*3/uL (ref 0.0–0.5)
Eosinophils Relative: 1 %
HCT: 51.3 % — ABNORMAL HIGH (ref 36.0–46.0)
Hemoglobin: 16.1 g/dL — ABNORMAL HIGH (ref 12.0–15.0)
Immature Granulocytes: 0 %
Lymphocytes Relative: 11 %
Lymphs Abs: 1 10*3/uL (ref 0.7–4.0)
MCH: 30.8 pg (ref 26.0–34.0)
MCHC: 31.4 g/dL (ref 30.0–36.0)
MCV: 98.3 fL (ref 80.0–100.0)
Monocytes Absolute: 0.5 10*3/uL (ref 0.1–1.0)
Monocytes Relative: 5 %
Neutro Abs: 7.6 10*3/uL (ref 1.7–7.7)
Neutrophils Relative %: 82 %
Platelets: 169 10*3/uL (ref 150–400)
RBC: 5.22 MIL/uL — ABNORMAL HIGH (ref 3.87–5.11)
RDW: 13.1 % (ref 11.5–15.5)
WBC: 9.1 10*3/uL (ref 4.0–10.5)
nRBC: 0 % (ref 0.0–0.2)

## 2021-11-19 LAB — URINALYSIS, ROUTINE W REFLEX MICROSCOPIC
Bilirubin Urine: NEGATIVE
Glucose, UA: NEGATIVE mg/dL
Ketones, ur: NEGATIVE mg/dL
Leukocytes,Ua: NEGATIVE
Nitrite: NEGATIVE
Protein, ur: NEGATIVE mg/dL
Specific Gravity, Urine: 1.013 (ref 1.005–1.030)
pH: 5.5 (ref 5.0–8.0)

## 2021-11-19 LAB — LACTIC ACID, PLASMA: Lactic Acid, Venous: 0.9 mmol/L (ref 0.5–1.9)

## 2021-11-19 MED ORDER — DOXYCYCLINE HYCLATE 100 MG PO CAPS: 100.0000 mg | ORAL_CAPSULE | Freq: Two times a day (BID) | ORAL | 0 refills | Status: AC

## 2021-11-19 NOTE — ED Triage Notes (Signed)
Pt states that she was evaluated in [place] on Thursday for acute CVA. Presents today c/o L wrist swelling and redness r/t previous IV site. Pt denies fevers. Serous/purulent drainage per pt.

## 2021-11-19 NOTE — Discharge Instructions (Addendum)
Return to the ED with any new or worsening signs or symptoms Please begin take antibiotic.  You will take this twice daily for the next 2 days.  Please be aware that this antibiotic does make you more susceptible to sunburn. Please read the attached guide concerning cellulitis Please continue taking your home blood pressure medication. Please follow-up with your PCP for ongoing management of your cellulitis

## 2021-11-19 NOTE — ED Notes (Signed)
Patient verbalizes understanding of discharge instructions. Opportunity for questioning and answers were provided. Patient discharged from ED.  °

## 2021-11-19 NOTE — ED Provider Notes (Signed)
[facility] EMERGENCY DEPT Provider Note   CSN: [id number] Arrival date & time: 11/19/21  1159     History  Chief Complaint  Patient presents with   Cellulitis    Rhonda Castro is a 46 y.o. female with medical history of recent CVA, alcohol abuse, anxiety, depression, diabetes, hypertension.  Patient presents to ED for evaluation of left hand cellulitis.  Patient reports that she was recently admitted for CVA in [place] from 11/9 to 11/11.  Patient states that after discharge she developed redness and swelling to the dorsum of her left hand.  The patient reports that the swelling and redness has progressively worsened since this time.  Patient states of the last 2 days she has had a wound on her hand that is been draining yellow material.  The patient denies any fevers, nausea or vomiting.  The patient denies any chest pain or shortness of breath.  Patient reports that today the pain traveled up her arm causing concern.  Patient reports that she went to urgent care and was redirected to ED for further management.  HPI     Home Medications Prior to Admission medications   Medication Sig Start Date End Date Taking? Authorizing Provider  doxycycline (VIBRAMYCIN) 100 MG capsule Take 1 capsule (100 mg total) by mouth 2 (two) times daily. 11/19/21  Yes Al Decant, PA-C  acetaminophen (TYLENOL) 500 MG tablet Take 500 mg by mouth every 6 (six) hours as needed for mild pain.    [provider]  FLUoxetine (PROZAC) 20 MG capsule TAKE 3 CAPSULES BY MOUTH EVERY DAY Patient not taking: Reported on 07/31/2016 06/06/15   [name], MD  hydrochlorothiazide (HYDRODIURIL) 12.5 MG tablet Take 1 tablet (12.5 mg total) by mouth daily. Patient not taking: Reported on 07/31/2016 02/23/15   [name], MD  nitrofurantoin, macrocrystal-monohydrate, (MACROBID) 100 MG capsule Take 1 capsule (100 mg total) by mouth 2 (two) times daily. X 7 days 07/31/16   [name],  April, MD  oxybutynin (DITROPAN-XL) 10 MG 24 hr tablet Take 10 mg by mouth daily.    [provider]  tamsulosin (FLOMAX) 0.4 MG CAPS capsule Take 0.4 mg by mouth daily.    [provider]  traMADol (ULTRAM) 50 MG tablet Take 1 tablet (50 mg total) by mouth every 6 (six) hours as needed. 07/31/16   [name], April, MD      Allergies    Codeine, Vicodin [hydrocodone-acetaminophen], Vistaril [hydroxyzine hcl], Aleve [naproxen sodium], and Penicillins    Review of Systems   Review of Systems  Constitutional:  Negative for fever.  Respiratory:  Negative for shortness of breath.   Cardiovascular:  Negative for chest pain.  Gastrointestinal:  Negative for diarrhea, nausea and vomiting.  Skin:  Positive for wound.  All other systems reviewed and are negative.   Physical Exam Updated Vital Signs BP (!) 188/104 (BP Location: Right Arm)   Pulse 74   Temp 98.8 F (37.1 C)   Resp 14   Ht 5\' 6"  (1.676 m)   Wt 60.3 kg   SpO2 100%   BMI 21.47 kg/m  Physical Exam Vitals and nursing note reviewed.  Constitutional:      General: She is not in acute distress.    Appearance: She is not ill-appearing, toxic-appearing or diaphoretic.  HENT:     Head: Normocephalic and atraumatic.     Nose: Nose normal. No congestion.     Mouth/Throat:     Mouth: Mucous membranes are  moist.     Pharynx: Oropharynx is clear.  Eyes:     Extraocular Movements: Extraocular movements intact.     Conjunctiva/sclera: Conjunctivae normal.     Pupils: Pupils are equal, round, and reactive to light.  Cardiovascular:     Rate and Rhythm: Normal rate and regular rhythm.  Pulmonary:     Effort: Pulmonary effort is normal.     Breath sounds: Normal breath sounds. No wheezing.  Abdominal:     General: Abdomen is flat. Bowel sounds are normal.     Palpations: Abdomen is soft.     Tenderness: There is no abdominal tenderness.  Musculoskeletal:     Cervical back: Normal range of motion and neck supple.  No tenderness.  Skin:    General: Skin is warm and dry.     Capillary Refill: Capillary refill takes less than 2 seconds.     Findings: Erythema present.       Neurological:     Mental Status: She is alert and oriented to person, place, and time.     ED Results / Procedures / Treatments   Labs (all labs ordered are listed, but only abnormal results are displayed) Labs Reviewed  CBC WITH DIFFERENTIAL/PLATELET - Abnormal; Notable for the following components:      Result Value   RBC 5.22 (*)    Hemoglobin 16.1 (*)    HCT 51.3 (*)    All other components within normal limits  URINALYSIS, ROUTINE W REFLEX MICROSCOPIC - Abnormal; Notable for the following components:   Hgb urine dipstick SMALL (*)    All other components within normal limits  LACTIC ACID, PLASMA  COMPREHENSIVE METABOLIC PANEL  PREGNANCY, URINE  LACTIC ACID, PLASMA    EKG None  Radiology US Venous Img Upper Left (DVT Study)  Result Date: 11/19/2021 CLINICAL DATA:  46 year old female with redness and swelling at site of prior IV. EXAM: LEFT UPPER EXTREMITY VENOUS DOPPLER ULTRASOUND TECHNIQUE: Gray-scale sonography with graded compression, as well as color Doppler and duplex ultrasound were performed to evaluate the upper extremity deep venous system from the level of the subclavian vein and including the jugular, axillary, basilic, radial, ulnar and upper cephalic vein. Spectral Doppler was utilized to evaluate flow at rest and with distal augmentation maneuvers. COMPARISON:  None Available. FINDINGS: Contralateral Subclavian Vein: Respiratory phasicity is normal and symmetric with the symptomatic side. No evidence of thrombus. Normal compressibility. Internal Jugular Vein: No evidence of thrombus. Normal compressibility, respiratory phasicity and response to augmentation. Subclavian Vein: No evidence of thrombus. Normal compressibility, respiratory phasicity and response to augmentation. Axillary Vein: No evidence of  thrombus. Normal compressibility, respiratory phasicity and response to augmentation. Cephalic Vein: No evidence of thrombus. Normal compressibility, respiratory phasicity and response to augmentation. Basilic Vein: No evidence of thrombus. Normal compressibility, respiratory phasicity and response to augmentation. Brachial Veins: No evidence of thrombus. Normal compressibility, respiratory phasicity and response to augmentation. Radial Veins: No evidence of thrombus. Normal compressibility, respiratory phasicity and response to augmentation. Ulnar Veins: No evidence of thrombus. Normal compressibility, respiratory phasicity and response to augmentation. Other Findings:  None visualized. IMPRESSION: No evidence of superficial thrombophlebitis or deep vein thrombosis in the left upper extremity. Marliss Coots, MD [facility] Radiology Electronically Signed   By: Marliss Coots M.D.   On: 11/19/2021 16:30   DG Hand Complete Left  Result Date: 11/19/2021 CLINICAL DATA:  Redness, swelling, and drainage at previous IV site EXAM: LEFT HAND - COMPLETE 3+ VIEW  COMPARISON:  None Available. FINDINGS: There is no acute fracture or dislocation. Bony alignment is normal. There is no erosive or destructive change. There is no significant appreciable soft tissue swelling. There is no soft tissue gas or radiopaque foreign body. IMPRESSION: No osseous erosion or destruction identified. No significant soft tissue swelling or soft tissue gas. Electronically Signed   By: Lesia Hausen M.D.   On: 11/19/2021 15:55    Procedures Procedures   Medications Ordered in ED Medications - No data to display  ED Course/ Medical Decision Making/ A&P                           Medical Decision Making Amount and/or Complexity of Data Reviewed Labs: ordered. Radiology: ordered.  Risk Prescription drug management.   46 year old female presents to the ED for evaluation of left hand  cellulitis.  Please see HPI for further details.  On my examination the patient is afebrile and nontachycardic.  Patient and sounds are clear bilaterally, she is not hypoxic.  Patient abdomen soft and compressible throughout.  Patient dorsum of left hand does have erythema, central sinus however no appreciable fluctuance is noted.  The patient does have erythema in this area consistent with cellulitis.  Lab work initiated in triage includes pregnancy test, CMP, urinalysis, CBC, lactic acid.  Patient CBC unremarkable, no leukocytosis.  Patient lactic acid not elevated at 0.9.  Patient CMP unremarkable.  After assessing the patient I included imaging studies to include ultrasound of left upper extremity due to the patient stating that the pain was traveling from this morning.  I would like to rule out thrombophlebitis, DVT.  The patient also will receive a x-ray of her left hand to rule out any kind of gas, findings consistent with osteomyelitis.  Imaging studies are negative for DVT, gas or any evidence of osteomyelitis.  Patient wound was ultrasounded by myself personally and there was not any notable fluid collection appreciated.  The patient will be placed on 10 days of doxycycline at this time and be referred back to her PCP for further management.  Patient blood pressure noted to be elevated here today.  The patient states that due to her recent CVA, her doctors have advised her to keep her pressure high until she is seen by her PCP in [place].    The patient was provided with return precautions and she voiced understanding.  The patient has had all of her questions answered to her satisfaction.  The patient stable for discharge.  Final Clinical Impression(s) / ED Diagnoses Final diagnoses:  Cellulitis of left upper extremity    Rx / DC Orders ED Discharge Orders          Ordered    doxycycline (VIBRAMYCIN) 100 MG capsule  2 times daily        11/19/21 1645              Al Decant, [place] 11/19/21 1700    Pricilla Loveless, MD 11/20/21 1700

## 2021-12-04 ENCOUNTER — Encounter: Admit: 2021-12-04 | Discharge: 2021-12-04 | Payer: BC Managed Care – PPO

## 2022-01-03 ENCOUNTER — Encounter: Admit: 2022-01-03 | Discharge: 2022-01-03 | Payer: BC Managed Care – PPO

## 2022-01-11 ENCOUNTER — Encounter: Admit: 2022-01-11 | Discharge: 2022-01-11 | Payer: BC Managed Care – PPO

## 2022-05-17 ENCOUNTER — Encounter: Admit: 2022-05-17 | Discharge: 2022-05-17 | Payer: BC Managed Care – PPO

## 2022-09-05 ENCOUNTER — Encounter: Admit: 2022-09-05 | Discharge: 2022-09-05 | Payer: BC Managed Care – PPO

## 2023-02-25 ENCOUNTER — Encounter: Admit: 2023-02-25 | Discharge: 2023-02-25 | Payer: BC Managed Care – PPO

## 2023-07-31 ENCOUNTER — Encounter: Admit: 2023-07-31 | Discharge: 2023-07-31 | Payer: BLUE CROSS/BLUE SHIELD
